# Patient Record
Sex: Female | Born: 1995 | Race: White | Hispanic: No | Marital: Single | Smoking: Never smoker
Health system: Southern US, Community
[De-identification: ages and names within clinical notes are randomized; demographics above are authoritative.]

## PROBLEM LIST (undated history)

## (undated) DIAGNOSIS — E232 Diabetes insipidus: Secondary | ICD-10-CM

## (undated) DIAGNOSIS — R569 Unspecified convulsions: Secondary | ICD-10-CM

## (undated) DIAGNOSIS — T7840XA Allergy, unspecified, initial encounter: Secondary | ICD-10-CM

## (undated) DIAGNOSIS — Q379 Unspecified cleft palate with unilateral cleft lip: Secondary | ICD-10-CM

## (undated) DIAGNOSIS — H409 Unspecified glaucoma: Secondary | ICD-10-CM

## (undated) HISTORY — DX: Unspecified cleft palate with unilateral cleft lip: Q37.9

## (undated) HISTORY — DX: Unspecified glaucoma: H40.9

## (undated) HISTORY — DX: Diabetes insipidus: E23.2

## (undated) HISTORY — DX: Unspecified convulsions: R56.9

## (undated) HISTORY — DX: Allergy, unspecified, initial encounter: T78.40XA

## (undated) HISTORY — PX: EYE SURGERY: SHX253

---
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
## 2014-08-18 ENCOUNTER — Encounter: Payer: Self-pay | Admitting: Family Medicine

## 2014-08-18 ENCOUNTER — Ambulatory Visit (INDEPENDENT_AMBULATORY_CARE_PROVIDER_SITE_OTHER): Payer: BLUE CROSS/BLUE SHIELD | Admitting: Family Medicine

## 2014-08-18 VITALS — BP 119/74 | HR 99 | Ht 64.0 in | Wt 124.0 lb

## 2014-08-18 DIAGNOSIS — G40812 Lennox-Gastaut syndrome, not intractable, without status epilepticus: Secondary | ICD-10-CM | POA: Insufficient documentation

## 2014-08-18 DIAGNOSIS — F79 Unspecified intellectual disabilities: Secondary | ICD-10-CM | POA: Insufficient documentation

## 2014-08-18 DIAGNOSIS — G40409 Other generalized epilepsy and epileptic syndromes, not intractable, without status epilepticus: Secondary | ICD-10-CM

## 2014-08-18 DIAGNOSIS — H409 Unspecified glaucoma: Secondary | ICD-10-CM

## 2014-08-18 DIAGNOSIS — E232 Diabetes insipidus: Secondary | ICD-10-CM

## 2014-08-18 DIAGNOSIS — R32 Unspecified urinary incontinence: Secondary | ICD-10-CM | POA: Insufficient documentation

## 2014-08-18 DIAGNOSIS — Q042 Holoprosencephaly: Secondary | ICD-10-CM | POA: Insufficient documentation

## 2014-08-18 NOTE — Progress Notes (Signed)
CC: Elizabeth [name] is a 19 y.o. 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 Over the past year she has not had any seizures. Understandably family would like to establish with a local neurologist. No new observed motor or sensory disturbances.  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 They've used injectable DDAVP and currently she is using oral tablets and there have been no issues with electrolyte abnormalities for at least the last year. They used to see a endocrinologist and understandably would like to see her local endocrinologist.  Review of Systems - General ROS: negative for - chills, fever, night sweats, weight gain or weight loss Psychological ROS: negative for - anxiety or depression ENT ROS: negative for - hearing change, nasal congestion, tinnitus or allergies Hematological and Lymphatic ROS: negative for - bleeding problems, bruising or swollen lymph nodes Breast ROS: negative Respiratory ROS: no cough, shortness of breath, or wheezing Cardiovascular ROS: no chest pain or dyspnea on exertion Gastrointestinal ROS: no abdominal pain, change in bowel habits, or black or bloody stools Genito-Urinary ROS: negative for - genital discharge, genital ulcers, abnormal bleeding from genitals. Positive for fecal and urinary incontinence  since birth Musculoskeletal ROS: negative for - joint pain or muscle pain Neurological ROS: negative for - headaches or memory loss Dermatological ROS: negative for lumps, mole changes, rash and skin lesion changes  Past Medical History  Diagnosis Date  . Cleft palate and cleft lip     Past Surgical History  Procedure Laterality Date  . Meniscus repair Right 2012   No family history on file.  History   Social History  . Marital Status: Single    Spouse Name: N/A  . Number of Children: N/A  . Years of Education: N/A   Occupational History  . Not on file.   Social History Main Topics  . Smoking status: Never Smoker   . Smokeless tobacco: Not on file  . Alcohol Use: Not on file  . Drug Use: No  . Sexual Activity: No   Other Topics Concern  . Not on file   Social History Narrative  . No narrative on file     Objective: BP 119/74 mmHg  Pulse 99  Ht 5\' 4"  (1.626 m)  Wt 124 lb (56.246 kg)  BMI 21.27 kg/m2  General:  No Acute Distress HEENT: Pupils equal, round.. Conjunctivae clear.  Moist mucous membranes parents unremarkable Lungs: Clear to auscultation bilaterally, no wheezing/ronchi/rales.  Comfortable work of breathing. Good air movement. Cardiac: Regular rate and rhythm. Normal S1/S2.  No murmurs, rubs, nor gallops.   Extremities: No peripheral edema.  Strong peripheral pulses.  Mental Status: No depression, anxiety, nor agitation. Skin: Warm and dry.  Assessment & Plan: Elizabeth [name] was seen today for establish care.  Diagnoses and all orders for this visit:  Myoclonic seizure disorder Orders: -     Ambulatory referral to  Neurology  Glaucoma Orders: -     Ambulatory referral to Ophthalmology  Diabetes insipidus Orders: -     Ambulatory referral to Endocrinology -     BASIC METABOLIC PANEL WITH GFR   Seizure disorder: Controlled, per family request referral to neurology Glaucoma: Referral to ophthalmology Diabetes insipidus: Clinically controlled  we'll get a baseline metabolic panel and referral to endocrinology.  Return for 3-6 months for routine follow up.

## 2014-08-19 ENCOUNTER — Encounter: Payer: Self-pay | Admitting: Family Medicine

## 2014-08-19 LAB — BASIC METABOLIC PANEL WITH GFR
BUN: 11 mg/dL (ref 6–23)
CHLORIDE: 112 meq/L (ref 96–112)
CO2: 25 meq/L (ref 19–32)
CREATININE: 0.86 mg/dL (ref 0.50–1.10)
Calcium: 8.9 mg/dL (ref 8.4–10.5)
GFR, Est African American: >89 mL/min
GFR, Est Non African American: >89 mL/min
GLUCOSE: 98 mg/dL (ref 70–99)
Potassium: 3.7 mEq/L (ref 3.5–5.3)
Sodium: 144 mEq/L (ref 135–145)

## 2014-08-19 NOTE — Addendum Note (Signed)
Addended by: Isaias Cowman C on: 08/19/2014 12:02 PM   Modules accepted: Medications

## 2014-08-24 ENCOUNTER — Encounter: Payer: Self-pay | Admitting: Family Medicine

## 2014-08-24 ENCOUNTER — Telehealth: Payer: Self-pay | Admitting: Family Medicine

## 2014-08-24 NOTE — Telephone Encounter (Signed)
Seth Bake, Will you please ask mother whether or not she's had any luck finding a vendor for diapers for Lyndi? If not please let me know because [employer] might be able to handle this.

## 2014-08-25 NOTE — Telephone Encounter (Signed)
Tried to call and no answer.  

## 2014-08-25 NOTE — Telephone Encounter (Signed)
(  [employer]) youth medium... Spoke with Elmo Putt(?) and she states they will only typically bill medicaid. But she said she would see if she could find something comparable for her in price. She is going to call the mother herself and speak with her. I did give her my phone to call back if she needs more infor from me

## 2014-09-21 ENCOUNTER — Telehealth: Payer: Self-pay | Admitting: *Deleted

## 2014-09-21 NOTE — Telephone Encounter (Signed)
Elizabeth [name],  Mom called and states she has not heard back about from endocrinologist. I saw where you sent everything in and they were supposed  to contact pt. When you get a chance can you look into this?

## 2014-10-09 ENCOUNTER — Telehealth: Payer: Self-pay | Admitting: *Deleted

## 2014-10-09 MED ORDER — ETHYNODIOL DIAC-ETH ESTRADIOL 1-35 MG-MCG PO TABS: 1.0000 | ORAL_TABLET | Freq: Every day | ORAL | Status: DC

## 2014-10-09 NOTE — Telephone Encounter (Signed)
Pt needs refill on BCP. Routing to provider

## 2014-10-09 NOTE — Telephone Encounter (Signed)
Rx sent to [facility] on [address]

## 2014-11-16 DIAGNOSIS — H547 Unspecified visual loss: Secondary | ICD-10-CM | POA: Insufficient documentation

## 2015-01-18 ENCOUNTER — Encounter: Payer: Self-pay | Admitting: Family Medicine

## 2015-01-18 ENCOUNTER — Ambulatory Visit (INDEPENDENT_AMBULATORY_CARE_PROVIDER_SITE_OTHER): Payer: BLUE CROSS/BLUE SHIELD | Admitting: Family Medicine

## 2015-01-18 VITALS — BP 128/72 | HR 104 | Wt 116.0 lb

## 2015-01-18 DIAGNOSIS — L819 Disorder of pigmentation, unspecified: Secondary | ICD-10-CM

## 2015-01-18 DIAGNOSIS — T148XXA Other injury of unspecified body region, initial encounter: Secondary | ICD-10-CM

## 2015-01-18 DIAGNOSIS — T148 Other injury of unspecified body region: Secondary | ICD-10-CM | POA: Diagnosis not present

## 2015-01-18 MED ORDER — NORETHINDRONE 0.35 MG PO TABS
1.0000 | ORAL_TABLET | Freq: Every day | ORAL | Status: DC
Start: 2015-01-18 — End: 2015-07-21

## 2015-01-18 NOTE — Progress Notes (Signed)
CC: Elizabeth [name] is a 19 y.o. female is here for Follow-up   Subjective: HPI:  Mother noticed a pigmented lesion on the back of the patient's scalp while combing her hair a few weeks ago. Patient does not seem to be bothered by it. Patient has never expressed pain or itching at the site. Mother states it is not getting bigger or smaller since she noticed it. There's been no fever, chills, swollen lymph nodes. No skin changes elsewhere.  During our encounter I pointed out that the patient has a splinter in the left palm. Mother states that she noticed that this morning but wasn't there yesterday. No interventions as of yet.  Mother wants to know she can pursue a different type of oral contraception medication to help control periods. Periods are still irregular despite use of combination OCP.   Review Of Systems Outlined In HPI  Past Medical History  Diagnosis Date  . Cleft palate and cleft lip   . DI (diabetes insipidus)     Past Surgical History  Procedure Laterality Date  . Meniscus repair Right 2012   Family History  Problem Relation Age of Onset  . Alcoholism Maternal Grandfather   . Cancer Maternal Grandmother   . Heart attack Maternal Grandmother   . Diabetes Maternal Grandmother     paternal grandfather    Social History   Social History  . Marital Status: Single    Spouse Name: N/A  . Number of Children: N/A  . Years of Education: N/A   Occupational History  . Not on file.   Social History Main Topics  . Smoking status: Never Smoker   . Smokeless tobacco: Not on file  . Alcohol Use: Not on file  . Drug Use: No  . Sexual Activity: No   Other Topics Concern  . Not on file   Social History Narrative     Objective: BP 128/72 mmHg  Pulse 104  Wt 116 lb (52.617 kg)  SpO2 99%  General: Alert, No Acute Distress HEENT: Pupils equal, round, reactive to light. Conjunctivae clear.  Moist mucous membranes. Lungs: Clear to auscultation bilaterally, no  wheezing/ronchi/rales.  Comfortable work of breathing. Good air movement. Cardiac: Regular rate and rhythm. Normal S1/S2.  No murmurs, rubs, nor gallops.   Extremities: No peripheral edema.  Strong peripheral pulses.  Mental Status: No depression, anxiety, nor agitation. Skin: Warm and dry. 3 mm diameter slightly raised mole on the back of the scalp just above the hairline in the midline. Single color, clear demarcated borders. Splinter in the palm of the left hand no signs of infection  Assessment & Plan: Elizabeth [name] was seen today for follow-up.  Diagnoses and all orders for this visit:  Splinter  Pigmented skin lesion  Other orders -     norethindrone (CAMILA) 0.35 MG tablet; Take 1 tablet (0.35 mg total) by mouth daily.   Splinter was removed using 18-gauge needle to slightly scratch the surface of the skin to allow forceps to easily remove the splinter in one piece. Pigmented skin lesion: Discussed this is most likely benign, will keep an eye on it but no need for biopsy at this time Discussed other hormonal ways to help control her menses, joint decision to start [place] prior to pursuing Depo-Medrol.  Return in about 3 months (around 04/20/2015).

## 2015-01-29 ENCOUNTER — Telehealth: Payer: Self-pay | Admitting: Family Medicine

## 2015-01-29 MED ORDER — FOLIC ACID 1 MG PO TABS: 1.0000 mg | ORAL_TABLET | Freq: Every day | ORAL | Status: DC

## 2015-01-29 NOTE — Telephone Encounter (Signed)
Refill req 

## 2015-04-20 ENCOUNTER — Encounter: Payer: Self-pay | Admitting: Family Medicine

## 2015-04-20 ENCOUNTER — Ambulatory Visit (INDEPENDENT_AMBULATORY_CARE_PROVIDER_SITE_OTHER): Payer: BLUE CROSS/BLUE SHIELD | Admitting: Family Medicine

## 2015-04-20 VITALS — BP 127/81 | HR 120 | Wt 111.0 lb

## 2015-04-20 DIAGNOSIS — L708 Other acne: Secondary | ICD-10-CM | POA: Diagnosis not present

## 2015-04-20 DIAGNOSIS — Z309 Encounter for contraceptive management, unspecified: Secondary | ICD-10-CM

## 2015-04-20 DIAGNOSIS — E232 Diabetes insipidus: Secondary | ICD-10-CM | POA: Diagnosis not present

## 2015-04-20 MED ORDER — CLINDAMYCIN HCL 300 MG PO CAPS: 300.0000 mg | ORAL_CAPSULE | Freq: Three times a day (TID) | ORAL | Status: DC

## 2015-04-20 NOTE — Progress Notes (Signed)
CC: Elizabeth [name] is a 19 y.o. female is here for Follow-up   Subjective: HPI: Accompanied by mother  Patient has been using Camilla without any known side effects. Mother notes that mood swings around the time of her menses are now greatly improved.   Follow-up diabetes: She continues to take desmopressin on a daily basis. No unintentional weight loss or gain. She had kidney function and sodium checked yesterday which were normal.  The family's only complaint is some small pustules that form on her bottom is she is left in a wet diaper for more than a few minutes. Mother usually pops these and they go away within a few days. Patient seems to scratch these spots but otherwise does not complain. Mother denies any skin changes elsewhere. There's been no fever, chills or swollen lymph nodes.    Review Of Systems Outlined In HPI  Past Medical History  Diagnosis Date  . Cleft palate and cleft lip   . DI (diabetes insipidus) [facility])     Past Surgical History  Procedure Laterality Date  . Meniscus repair Right 2012   Family History  Problem Relation Age of Onset  . Alcoholism Maternal Grandfather   . Cancer Maternal Grandmother   . Heart attack Maternal Grandmother   . Diabetes Maternal Grandmother     paternal grandfather    Social History   Social History  . Marital Status: Single    Spouse Name: N/A  . Number of Children: N/A  . Years of Education: N/A   Occupational History  . Not on file.   Social History Main Topics  . Smoking status: Never Smoker   . Smokeless tobacco: Not on file  . Alcohol Use: Not on file  . Drug Use: No  . Sexual Activity: No   Other Topics Concern  . Not on file   Social History Narrative     Objective: BP 127/81 mmHg  Pulse 120  Wt 111 lb (50.349 kg)  General: Alert and Oriented, No Acute Distress HEENT: Pupils equal, round, reactive to light. Conjunctivae clear.  Moist mucous membranes Lungs: Clear to auscultation bilaterally, no  wheezing/ronchi/rales.  Comfortable work of breathing. Good air movement. Cardiac: Regular rate and rhythm. Normal S1/S2.  No murmurs, rubs, nor gallops.  Extremities: No peripheral edema.  Strong peripheral pulses.  Mental Status: No depression, anxiety, nor agitation. Skin: Warm and dry.  Assessment & Plan: Elizabeth [name] was seen today for follow-up.  Diagnoses and all orders for this visit:  Diabetes insipidus ([place])  Encounter for contraceptive management, unspecified encounter  Follicular acne -     clindamycin (CLEOCIN) 300 MG capsule; Take 1 capsule (300 mg total) by mouth 3 (three) times daily.   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  Return in about 3 months (around 07/20/2015) for routine follow up.

## 2015-07-20 ENCOUNTER — Ambulatory Visit: Payer: BLUE CROSS/BLUE SHIELD | Admitting: Family Medicine

## 2015-07-21 ENCOUNTER — Ambulatory Visit (INDEPENDENT_AMBULATORY_CARE_PROVIDER_SITE_OTHER): Payer: Medicaid Other | Admitting: Family Medicine

## 2015-07-21 ENCOUNTER — Telehealth: Payer: Self-pay | Admitting: Family Medicine

## 2015-07-21 ENCOUNTER — Encounter: Payer: Self-pay | Admitting: Family Medicine

## 2015-07-21 VITALS — BP 110/71 | HR 87 | Temp 98.2°F | Resp 18 | Wt 113.2 lb

## 2015-07-21 DIAGNOSIS — N926 Irregular menstruation, unspecified: Secondary | ICD-10-CM | POA: Diagnosis not present

## 2015-07-21 DIAGNOSIS — G40409 Other generalized epilepsy and epileptic syndromes, not intractable, without status epilepticus: Secondary | ICD-10-CM

## 2015-07-21 MED ORDER — NORGESTIMATE-ETH ESTRADIOL 0.25-35 MG-MCG PO TABS: 1.0000 | ORAL_TABLET | Freq: Every day | ORAL | Status: DC

## 2015-07-21 NOTE — Progress Notes (Signed)
CC: Elizabeth [name] is a 20 y.o. female is here for Gynecologic Exam   Subjective: HPI:  Follow-up contraceptive management. Patient is taking Camila at 6 PM every day. Despite this she still has a predictable menstrual cycle about every 4 weeks around the same time as her mother's. Mother has also noticed the patient gets urinary frequency and urgency around the times of her menstrual cycle. There's been no other genitourinary complaints. No fevers, chills or abdominal pain. Patient denies pelvic pain.  There would like to know if they can switch to a different neurologist that specializes in epilepsy. They're having difficulty with medication coordination with her current neurologist, Dr. Joesph July.  Review Of Systems Outlined In HPI  Past Medical History  Diagnosis Date  . Cleft palate and cleft lip   . DI (diabetes insipidus) [facility])     Past Surgical History  Procedure Laterality Date  . Meniscus repair Right 2012   Family History  Problem Relation Age of Onset  . Alcoholism Maternal Grandfather   . Cancer Maternal Grandmother   . Heart attack Maternal Grandmother   . Diabetes Maternal Grandmother     paternal grandfather    Social History   Social History  . Marital Status: Single    Spouse Name: N/A  . Number of Children: N/A  . Years of Education: N/A   Occupational History  . Not on file.   Social History Main Topics  . Smoking status: Never Smoker   . Smokeless tobacco: Not on file  . Alcohol Use: Not on file  . Drug Use: No  . Sexual Activity: No   Other Topics Concern  . Not on file   Social History Narrative     Objective: BP 110/71 mmHg  Pulse 87  Temp(Src) 98.2 F (36.8 C) (Axillary)  Resp 18  Wt 113 lb 3.2 oz (51.347 kg)  SpO2 100%  Vital signs reviewed. General: Alert and Oriented, No Acute Distress HEENT: Pupils equal, round, reactive to light. Conjunctivae clear.  External ears unremarkable.  Moist mucous membranes. Lungs: Clear and  comfortable work of breathing, speaking in full sentences without accessory muscle use. Cardiac: Regular rate and rhythm.  Neuro: CN II-XII grossly intact, gait normal. Extremities: No peripheral edema.  Strong peripheral pulses.  Mental Status: No depression, anxiety, nor agitation. Logical though process. Skin: Warm and dry.  Assessment & Plan: [name] was seen today for gynecologic exam.  Diagnoses and all orders for this visit:  Irregular periods  Other orders -     norgestimate-ethinyl estradiol ([place] 28) 0.25-35 MG-MCG tablet; Take 1 tablet by mouth daily.   Switching formulations of her oral birth control, will start [place] with a regimen of starting a new pack when she reaches the placebo pills for 2 consecutive packs then using the placebo pills at the end of the third month. This will hopefully reduce her menstrual cycle to 4 times a year.  Referral to a separate epilepsy specialist within the [facility] system has been placed.    Return in about 3 months (around 10/21/2015).

## 2015-07-21 NOTE — Telephone Encounter (Signed)
Will you please let patient's mother know that I've placed a referral within the [facility] system [place] her [place]'s neurologist changed to Dr. Ishmael Holter Munger-Clary who also takes care of another patient of mine who has a vagal nerve stimulator.

## 2015-07-21 NOTE — Telephone Encounter (Signed)
Notified mother of referral.

## 2015-10-21 ENCOUNTER — Encounter: Payer: Self-pay | Admitting: Family Medicine

## 2015-10-21 ENCOUNTER — Ambulatory Visit (INDEPENDENT_AMBULATORY_CARE_PROVIDER_SITE_OTHER): Payer: Medicaid Other | Admitting: Family Medicine

## 2015-10-21 VITALS — BP 124/68 | HR 74 | Wt 115.0 lb

## 2015-10-21 DIAGNOSIS — N926 Irregular menstruation, unspecified: Secondary | ICD-10-CM

## 2015-10-21 NOTE — Progress Notes (Signed)
CC: Elizabeth [name] is a 20 y.o. female is here for Metrorrhagia   Subjective: HPI:  Mother reports that since changing birth control formations there's been no real improvement with her irregular periods. Elizabeth [name] and she just had 5 days of light menstrual bleeding 10 days without bleeding and started bleeding yesterday. Bleeding is always described as light without clots. There's been no other bleeding abnormalities or bruising. There's been no other vaginal discharge or urinary complaints.   Review Of Systems Outlined In HPI  Past Medical History  Diagnosis Date  . Cleft palate and cleft lip   . DI (diabetes insipidus) [facility])     Past Surgical History  Procedure Laterality Date  . Meniscus repair Right 2012   Family History  Problem Relation Age of Onset  . Alcoholism Maternal Grandfather   . Cancer Maternal Grandmother   . Heart attack Maternal Grandmother   . Diabetes Maternal Grandmother     paternal grandfather    Social History   Social History  . Marital Status: Single    Spouse Name: N/A  . Number of Children: N/A  . Years of Education: N/A   Occupational History  . Not on file.   Social History Main Topics  . Smoking status: Never Smoker   . Smokeless tobacco: Not on file  . Alcohol Use: Not on file  . Drug Use: No  . Sexual Activity: No   Other Topics Concern  . Not on file   Social History Narrative     Objective: BP 124/68 mmHg  Pulse 74  Wt 115 lb (52.164 kg)  Vital signs reviewed. General: Alert , No Acute Distress HEENT: Pupils equal, round, reactive to light. Conjunctivae clear.  External ears unremarkable.  Moist mucous membranes. Lungs: Clear and comfortable work of breathing, speaking in full sentences without accessory muscle use. Cardiac: Regular rate and rhythm.  Neuro: CN II-XII grossly intact, gait normal. Extremities: No peripheral edema.  Strong peripheral pulses.  Mental Status: No depression, anxiety, nor agitation. Logical  though process. Skin: Warm and dry. Assessment & Plan: Elizabeth [name] was seen today for metrorrhagia.  Diagnoses and all orders for this visit:  Irregular periods -     Prolactin -     TSH -     CBC   Rule out platelet abnormality and hormonal abnormality with the above labs. They plan on getting this done when they see the endocrinologist on the seventh of this month  25 minutes spent face-to-face during visit today of which at least 50% was counseling or coordinating care regarding: 1. Irregular periods      Return if symptoms worsen or fail to improve.

## 2015-11-03 ENCOUNTER — Other Ambulatory Visit: Payer: Self-pay

## 2015-11-03 MED ORDER — NORGESTIMATE-ETH ESTRADIOL 0.25-35 MG-MCG PO TABS: 1.0000 | ORAL_TABLET | Freq: Every day | ORAL | Status: DC

## 2015-11-04 ENCOUNTER — Telehealth: Payer: Self-pay | Admitting: Family Medicine

## 2015-11-04 ENCOUNTER — Other Ambulatory Visit: Payer: Self-pay

## 2015-11-04 DIAGNOSIS — N926 Irregular menstruation, unspecified: Secondary | ICD-10-CM

## 2015-11-04 MED ORDER — NORGESTIMATE-ETH ESTRADIOL 0.25-35 MG-MCG PO TABS: 1.0000 | ORAL_TABLET | Freq: Every day | ORAL | Status: DC

## 2015-11-04 NOTE — Telephone Encounter (Signed)
Recommendations left on mothers vm

## 2015-11-04 NOTE — Telephone Encounter (Signed)
Will you please let patient's mother know that her hormonal labs were normal.  If her periods continue to be irregular in one month please f/u to discuss further options.

## 2016-01-17 ENCOUNTER — Other Ambulatory Visit: Payer: Self-pay | Admitting: Family Medicine

## 2016-02-22 ENCOUNTER — Ambulatory Visit (INDEPENDENT_AMBULATORY_CARE_PROVIDER_SITE_OTHER): Payer: Medicaid Other | Admitting: Physician Assistant

## 2016-02-22 ENCOUNTER — Encounter: Payer: Self-pay | Admitting: Physician Assistant

## 2016-02-22 VITALS — BP 124/76 | HR 104 | Ht 64.0 in | Wt 122.0 lb

## 2016-02-22 DIAGNOSIS — N921 Excessive and frequent menstruation with irregular cycle: Secondary | ICD-10-CM

## 2016-02-22 MED ORDER — MEDROXYPROGESTERONE ACETATE 150 MG/ML IM SUSP
150.0000 mg | Freq: Once | INTRAMUSCULAR | Status: AC
  Administered 2016-02-22: 150 mg via INTRAMUSCULAR

## 2016-02-22 NOTE — Addendum Note (Signed)
Addended by: Beatris Ship L on: 02/22/2016 09:50 AM   Modules accepted: Orders

## 2016-02-22 NOTE — Progress Notes (Signed)
   Subjective:    Patient ID: Elizabeth [name], female    DOB: Nov 25, 1995, 20 y.o.   MRN: [id number]  HPI Pt is a 20 yo female with mental retardation that presents to the clinic to establish care after Dr. Ileene Rubens leaving practice.   She was on OCP up to 3 weeks. It was not helping to control bleeding. She was still having long monthly periods and spotting and it is hard for her caregiver to take care of her on her period. She would like to try something else.     Review of Systems  All other systems reviewed and are negative.      Objective:   Physical Exam  Constitutional: She is oriented to person, place, and time. She appears well-developed and well-nourished.  HENT:  Head: Normocephalic and atraumatic.  Cardiovascular: Normal rate, regular rhythm and normal heart sounds.   Pulmonary/Chest: Effort normal and breath sounds normal.  Neurological: She is alert and oriented to person, place, and time.  Psychiatric:  Non-verbal.           Assessment & Plan:  Menorrhagia with irregular cycle- started depo shot today. Follow up in 3 months. Discussed risk factors.

## 2016-05-24 ENCOUNTER — Encounter: Payer: Self-pay | Admitting: Physician Assistant

## 2016-05-24 ENCOUNTER — Ambulatory Visit (INDEPENDENT_AMBULATORY_CARE_PROVIDER_SITE_OTHER): Payer: Medicaid Other | Admitting: Physician Assistant

## 2016-05-24 VITALS — BP 125/81 | HR 99 | Ht 64.0 in | Wt 119.0 lb

## 2016-05-24 DIAGNOSIS — N921 Excessive and frequent menstruation with irregular cycle: Secondary | ICD-10-CM | POA: Diagnosis not present

## 2016-05-24 MED ORDER — MEDROXYPROGESTERONE ACETATE 150 MG/ML IM SUSP
150.0000 mg | Freq: Once | INTRAMUSCULAR | Status: AC
  Administered 2016-05-24: 150 mg via INTRAMUSCULAR

## 2016-05-24 NOTE — Progress Notes (Signed)
   Subjective:    Patient ID: Elizabeth [name], female    DOB: 03/15/1996, 21 y.o.   MRN: [id number]  HPI Pt is a 21 yo female who presents to the clinic for depo shot. She was having heavy periods that was hard for her caregiver to take care of since she is mentally retarded. She has no complaints. She is tolerating shot well. She has had some minor spotting a few times but [facility] better. She is happy with results. She has lost 3lbs.    Review of Systems  All other systems reviewed and are negative.      Objective:   Physical Exam  Constitutional: She appears well-developed and well-nourished.  HENT:  Head: Normocephalic and atraumatic.  Cardiovascular: Normal rate, regular rhythm and normal heart sounds.   Pulmonary/Chest: Effort normal and breath sounds normal.  Neurological: She is alert.  Psychiatric: She has a normal mood and affect. Her behavior is normal.          Assessment & Plan:  Marland KitchenMarland KitchenDiagnoses and all orders for this visit:  Menorrhagia with irregular cycle -     medroxyPROGESTERone (DEPO-PROVERA) injection 150 mg; Inject 1 mL (150 mg total) into the muscle once.   Depo shot given today. Follow up nurse visit in 3 months.

## 2016-08-11 ENCOUNTER — Encounter: Payer: Self-pay | Admitting: Physician Assistant

## 2016-08-11 ENCOUNTER — Ambulatory Visit (INDEPENDENT_AMBULATORY_CARE_PROVIDER_SITE_OTHER): Payer: Medicaid Other | Admitting: Physician Assistant

## 2016-08-11 VITALS — BP 110/68 | HR 100 | Wt 119.3 lb

## 2016-08-11 DIAGNOSIS — N926 Irregular menstruation, unspecified: Secondary | ICD-10-CM

## 2016-08-11 DIAGNOSIS — N921 Excessive and frequent menstruation with irregular cycle: Secondary | ICD-10-CM | POA: Diagnosis not present

## 2016-08-11 MED ORDER — MEDROXYPROGESTERONE ACETATE 150 MG/ML IM SUSP
150.0000 mg | Freq: Once | INTRAMUSCULAR | Status: AC
  Administered 2016-08-11: 150 mg via INTRAMUSCULAR

## 2016-08-11 NOTE — Progress Notes (Signed)
   Subjective:    Patient ID: Elizabeth [name], female    DOB: Jan 24, 1996, 21 y.o.   MRN: [id number]  HPI Pt was here today for a medroxyprogesterone acetate injection. Denies chest pain, shortness of breath, or any medication problems.   Review of Systems     Objective:   Physical Exam        Assessment & Plan:  Pt tolerated LUOQ well without any complications. Pt advised to schedule a nurse visit in 3 months.

## 2016-09-19 ENCOUNTER — Other Ambulatory Visit: Payer: Self-pay

## 2016-09-19 MED ORDER — FOLIC ACID 1 MG PO TABS: 1.0000 mg | ORAL_TABLET | Freq: Every day | ORAL | 2 refills | Status: DC

## 2016-11-01 ENCOUNTER — Ambulatory Visit (INDEPENDENT_AMBULATORY_CARE_PROVIDER_SITE_OTHER): Payer: Medicaid Other | Admitting: Physician Assistant

## 2016-11-01 VITALS — BP 109/68 | HR 108

## 2016-11-01 DIAGNOSIS — Z3042 Encounter for surveillance of injectable contraceptive: Secondary | ICD-10-CM | POA: Diagnosis not present

## 2016-11-01 MED ORDER — MEDROXYPROGESTERONE ACETATE 150 MG/ML IM SUSP
150.0000 mg | Freq: Once | INTRAMUSCULAR | Status: AC
  Administered 2016-11-01: 150 mg via INTRAMUSCULAR

## 2016-11-01 NOTE — Progress Notes (Signed)
Pt came into clinic today, accompanied by her mother, for her depo-provera injection. Pt's mother reports no negative side effects and does state the Pt had some spotting but no full menstrual cycle. Pt tolerated injection in RUOQ well, no immediate complications. Print out given for next injection window dates in August-Septemeber.   Agree with above plan. [name] Planas PA-C

## 2016-11-29 ENCOUNTER — Ambulatory Visit: Payer: Medicaid Other

## 2017-01-31 ENCOUNTER — Ambulatory Visit (INDEPENDENT_AMBULATORY_CARE_PROVIDER_SITE_OTHER): Payer: Medicaid Other | Admitting: Physician Assistant

## 2017-01-31 VITALS — BP 109/73 | HR 103

## 2017-01-31 DIAGNOSIS — Z309 Encounter for contraceptive management, unspecified: Secondary | ICD-10-CM

## 2017-01-31 DIAGNOSIS — Z30013 Encounter for initial prescription of injectable contraceptive: Secondary | ICD-10-CM

## 2017-01-31 MED ORDER — MEDROXYPROGESTERONE ACETATE 150 MG/ML IM SUSP
150.0000 mg | Freq: Once | INTRAMUSCULAR | Status: AC
  Administered 2017-01-31: 150 mg via INTRAMUSCULAR

## 2017-01-31 NOTE — Progress Notes (Signed)
Pt is here accompanied by her mother for depo-provera injection.  No negative side effects noted.  Given in [place], tolerated procedure well.  Print out given with window of dates in November and December for next injection.  Agree with above plan. [name] Planas PA-C

## 2017-04-02 ENCOUNTER — Telehealth: Payer: Self-pay | Admitting: Physician Assistant

## 2017-04-02 NOTE — Telephone Encounter (Signed)
Luvenia Starch,    My aunt called and stated Brunetta has had a head cold with bad cough for almost a week she was giving her generic brand tylenol and she also had a temperature but hasnt had one in the last few days. She was asking if you could call her because due to her medications she is not sure what she could give her over the counter for this. Chasty is eating just not as much as she usually does. Terri's number is [phone]. Thank you

## 2017-04-02 NOTE — Telephone Encounter (Signed)
Called patient and discussed deslym to try. If not improving in next few days come in for office visit.

## 2017-04-24 ENCOUNTER — Ambulatory Visit (INDEPENDENT_AMBULATORY_CARE_PROVIDER_SITE_OTHER): Payer: Medicaid Other | Admitting: Physician Assistant

## 2017-04-24 ENCOUNTER — Encounter: Payer: Self-pay | Admitting: Physician Assistant

## 2017-04-24 VITALS — BP 113/95 | HR 87

## 2017-04-24 DIAGNOSIS — R2231 Localized swelling, mass and lump, right upper limb: Secondary | ICD-10-CM | POA: Insufficient documentation

## 2017-04-24 DIAGNOSIS — Z309 Encounter for contraceptive management, unspecified: Secondary | ICD-10-CM | POA: Diagnosis not present

## 2017-04-24 MED ORDER — MEDROXYPROGESTERONE ACETATE 150 MG/ML IM SUSP
150.0000 mg | Freq: Once | INTRAMUSCULAR | Status: AC
  Administered 2017-04-24: 150 mg via INTRAMUSCULAR

## 2017-04-24 NOTE — Progress Notes (Addendum)
   Subjective:    Patient ID: Elizabeth [name], female    DOB: 03/03/1996, 21 y.o.   MRN: [id number]  HPI [name] is a 22 y/o essentially non verbal female who presents today for depo shot and lump in right armpit.   Her caregiver reports that the lump appeared about 4 weeks ago, went away, then reappeared in the last week. Her caregiver has not noticed any erythema, warm, or discoloration of the skin and states that the patient does not appear to be exhibiting signs of pain in the area. She has not been able to try warm compresses on the area because the patient does not like them.no fever, chills, body aches.   She is tolerating the depo shot well.  .. Active Ambulatory Problems    Diagnosis Date Noted  . 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 Mental retardation 08/18/2014  . 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 Incontinence 08/18/2014  . Glaucoma 08/18/2014  . 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 Irregular periods 07/21/2015  . Menorrhagia with irregular cycle 02/22/2016   Resolved Ambulatory Problems    Diagnosis Date Noted  . No Resolved Ambulatory Problems   Past Medical History:  Diagnosis Date  . Cleft palate and cleft lip   . DI (diabetes insipidus) ([place])      Review of Systems See HPI, all other systems reviewed negative    Objective:   Physical Exam  Constitutional: She appears well-developed and well-nourished.  HENT:  Head: Normocephalic and atraumatic.  Cardiovascular: Normal rate, regular rhythm and normal heart sounds.  Pulmonary/Chest: Effort normal and breath sounds normal.  Neurological: She is alert.  Skin: Skin is warm and dry.  There is a hard, mobile, lump under the skin of the right axilla. It is not warm or erythematous, no ecchymosis, and no skin breakdown present. No tenderness to palpation.   No other lymphadenopathy palpated in right axilla.   Psychiatric: She has a normal mood and affect. Her behavior is normal.  Vitals  reviewed.      Assessment & Plan:  ...Avonda was seen today for mass right axilla and contraception.  Diagnoses and all orders for this visit:  Encounter for contraceptive management, unspecified type -     medroxyPROGESTERone (DEPO-PROVERA) injection 150 mg  Mass of axilla, right   Contraceptive management - Depo shot given today. Patient is tolerating these well. Return for follow-up for next depo shot between February 19-March 5.  Mass of right axilla - Advised patients caregiver to apply warm compresses to area 2x a day for 3-4 weeks. If the masse does not resolve with warm compresses then will get [place] to assess for cyst or other causes of the mass. Advised caregiver that if it grows in size, becomes erythematous, warm, or painful to follow-up for reevaluation. -reassurance no other lymphadenopathy palpated.

## 2017-05-09 ENCOUNTER — Encounter (INDEPENDENT_AMBULATORY_CARE_PROVIDER_SITE_OTHER): Payer: Self-pay

## 2017-07-12 ENCOUNTER — Other Ambulatory Visit: Payer: Self-pay | Admitting: *Deleted

## 2017-07-12 MED ORDER — FOLIC ACID 1 MG PO TABS: 1.0000 mg | ORAL_TABLET | Freq: Every day | ORAL | 3 refills | Status: DC

## 2017-07-18 ENCOUNTER — Ambulatory Visit (INDEPENDENT_AMBULATORY_CARE_PROVIDER_SITE_OTHER): Payer: Medicaid Other | Admitting: Physician Assistant

## 2017-07-18 VITALS — BP 128/73 | HR 81

## 2017-07-18 DIAGNOSIS — Z3042 Encounter for surveillance of injectable contraceptive: Secondary | ICD-10-CM | POA: Diagnosis not present

## 2017-07-18 MED ORDER — MEDROXYPROGESTERONE ACETATE 150 MG/ML IM SUSP
150.0000 mg | Freq: Once | INTRAMUSCULAR | Status: AC
  Administered 2017-07-18: 150 mg via INTRAMUSCULAR

## 2017-07-18 NOTE — Progress Notes (Signed)
Patient came in office for Depo shot. She is within the administration window. Patients mother reports no negative side effects. Patient tolerated injection in RUOQ well, no immediate complications. Printed schedule for next injection, given to mother.  Patients mother reports she recently had an EEG at [facility], routing to PCP.

## 2017-10-03 ENCOUNTER — Ambulatory Visit (INDEPENDENT_AMBULATORY_CARE_PROVIDER_SITE_OTHER): Payer: Medicaid Other | Admitting: Physician Assistant

## 2017-10-03 VITALS — BP 112/67 | HR 84

## 2017-10-03 DIAGNOSIS — Z3042 Encounter for surveillance of injectable contraceptive: Secondary | ICD-10-CM

## 2017-10-03 MED ORDER — MEDROXYPROGESTERONE ACETATE 150 MG/ML IM SUSP
150.0000 mg | Freq: Once | INTRAMUSCULAR | Status: AC
  Administered 2017-10-03: 150 mg via INTRAMUSCULAR

## 2017-10-03 NOTE — Progress Notes (Signed)
Pt is here accompanied by her mother for depo-provera injection.  No negative side effects noted.  Given in [place], tolerated procedure well.  Print out given with window of dates for next injection.  Agree with above plan. [name] Planas PA-C

## 2017-12-19 ENCOUNTER — Ambulatory Visit: Payer: Medicaid Other

## 2017-12-20 ENCOUNTER — Ambulatory Visit (INDEPENDENT_AMBULATORY_CARE_PROVIDER_SITE_OTHER): Payer: Medicaid Other | Admitting: Physician Assistant

## 2017-12-20 VITALS — BP 123/78 | HR 82 | Temp 98.4°F | Wt 113.0 lb

## 2017-12-20 DIAGNOSIS — Z3042 Encounter for surveillance of injectable contraceptive: Secondary | ICD-10-CM

## 2017-12-20 MED ORDER — MEDROXYPROGESTERONE ACETATE 150 MG/ML IM SUSP
150.0000 mg | Freq: Once | INTRAMUSCULAR | Status: AC
  Administered 2017-12-20: 150 mg via INTRAMUSCULAR

## 2017-12-20 NOTE — Progress Notes (Signed)
   Subjective:    Patient ID: Elizabeth [name], female    DOB: 06/07/1995, 22 y.o.   MRN: [id number]  HPI Patient presents to office with mother for a Depo Provera injection. Denies chest pain, shortness of breath, headaches, mood changes or problems with medication. Pt is within 11-14 week window.   Review of Systems     Objective:   Physical Exam        Assessment & Plan:  Patient tolerated injection well without complications in RUOQ. Print out given with window of dates for next injection.  Agree with the above plan. [name] Planas PA-C

## 2018-01-14 ENCOUNTER — Other Ambulatory Visit: Payer: Self-pay

## 2018-01-14 DIAGNOSIS — R32 Unspecified urinary incontinence: Secondary | ICD-10-CM

## 2018-01-14 MED ORDER — PREVAIL ADJ UNDERWEAR SM/MED MISC: 99 refills | Status: AC

## 2018-01-14 MED ORDER — PREVAIL ADJ UNDERWEAR SM/MED MISC: 99 refills | Status: DC

## 2018-02-01 ENCOUNTER — Encounter: Payer: Self-pay | Admitting: Physician Assistant

## 2018-02-01 ENCOUNTER — Ambulatory Visit (INDEPENDENT_AMBULATORY_CARE_PROVIDER_SITE_OTHER): Payer: Medicaid Other | Admitting: Physician Assistant

## 2018-02-01 VITALS — BP 97/52 | HR 85 | Ht 64.0 in | Wt 117.0 lb

## 2018-02-01 DIAGNOSIS — R454 Irritability and anger: Secondary | ICD-10-CM | POA: Insufficient documentation

## 2018-02-01 DIAGNOSIS — Z0000 Encounter for general adult medical examination without abnormal findings: Secondary | ICD-10-CM

## 2018-02-01 NOTE — Patient Instructions (Signed)

## 2018-02-01 NOTE — Progress Notes (Signed)
   Subjective:    Patient ID: Elizabeth [name], female    DOB: 1995-09-16, 22 y.o.   MRN: [id number]  HPI  Patient is a 22 year old female who is accompanied by her caregiver.  She is here for her yearly wellness exam. Pt does have pmhx of DI, seizure disorder, holoprosencephly.  They have no complaints today.  She sees neurologist every 6 months as well as endocrinology every 6 months.  She has had 2 seizures in the last month.  They did start CBD oil and is been very good for her mood.  She feels like her agitation comes out much less.  They are doing 1 drop twice a day.  .. Active Ambulatory Problems    Diagnosis Date Noted  . 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 Mental retardation 08/18/2014  . 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 Incontinence 08/18/2014  . Glaucoma 08/18/2014  . 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 Irregular periods 07/21/2015  . Menorrhagia with irregular cycle 02/22/2016  . Mass of axilla, right 04/24/2017  . Irritability 02/01/2018   Resolved Ambulatory Problems    Diagnosis Date Noted  . No Resolved Ambulatory Problems   Past Medical History:  Diagnosis Date  . Cleft palate and cleft lip   . DI (diabetes insipidus) ([place])    .[name] Family History  Problem Relation Age of Onset  . Alcoholism Maternal Grandfather   . Cancer Maternal Grandmother   . Heart attack Maternal Grandmother   . Diabetes Maternal Grandmother        paternal grandfather   .[name] Social History   Social History Narrative  . Not on file       Review of Systems  All other systems reviewed and are negative.      Objective:   Physical Exam  Constitutional: She is oriented to person, place, and time. She appears well-developed and well-nourished.  HENT:  Head: Normocephalic and atraumatic.  Right Ear: External ear normal.  Left Ear: External ear normal.  Nose: Nose normal.  Mouth/Throat: Oropharynx is clear and moist.  Eyes: Conjunctivae and EOM are normal.   Neck: Normal range of motion. Neck supple. No thyromegaly present.  Cardiovascular: Normal rate and regular rhythm.  Pulmonary/Chest: Effort normal and breath sounds normal.  Abdominal: Soft. Bowel sounds are normal. She exhibits no distension and no mass. There is no tenderness. There is no rebound and no guarding.  Musculoskeletal: Normal range of motion.  Lymphadenopathy:    She has no cervical adenopathy.  Neurological: She is alert and oriented to person, place, and time.  Skin: No rash noted.  Psychiatric: She has a normal mood and affect. Her behavior is normal.          Assessment & Plan:  Marland KitchenMarland KitchenDiagnoses and all orders for this visit:  Routine physical examination  Irritability   Overall patient is doing good. She did declined to flu shot. Per caregiver her pediatrician advised not to long ago. I stated I am uncertain of any interaction. Bring up with neurologist and endocrinologist. If all say yes then lets get it.   Labs 12/2017. Reviewed. No risk factors for CV. Will add lipid to labs at some point.   Continue every 3 months for depo shot.   Continue with CBD oil.   Discussed staying active.

## 2018-03-07 ENCOUNTER — Ambulatory Visit (INDEPENDENT_AMBULATORY_CARE_PROVIDER_SITE_OTHER): Payer: Medicaid Other | Admitting: Osteopathic Medicine

## 2018-03-07 VITALS — BP 109/67 | HR 94 | Ht 64.0 in | Wt 117.0 lb

## 2018-03-07 DIAGNOSIS — Z3042 Encounter for surveillance of injectable contraceptive: Secondary | ICD-10-CM | POA: Diagnosis not present

## 2018-03-07 MED ORDER — MEDROXYPROGESTERONE ACETATE 150 MG/ML IM SUSP
150.0000 mg | Freq: Once | INTRAMUSCULAR | Status: AC
  Administered 2018-03-07: 150 mg via INTRAMUSCULAR

## 2018-03-07 NOTE — Progress Notes (Signed)
Patient presents to office with mother for a Depo Provera injection. Patient denies chest pain, shortness of breath, headaches, mood changes, or any problems with medication. Patient is within 11-14 week window.   Depo Provera injection given in the RUOQ without complications. Next injection window is January 2-16, and patient's mother was advised.

## 2018-05-27 ENCOUNTER — Encounter: Payer: Self-pay | Admitting: Physician Assistant

## 2018-05-27 ENCOUNTER — Ambulatory Visit (INDEPENDENT_AMBULATORY_CARE_PROVIDER_SITE_OTHER): Payer: Medicaid Other | Admitting: Physician Assistant

## 2018-05-27 ENCOUNTER — Ambulatory Visit: Payer: Medicaid Other

## 2018-05-27 VITALS — BP 120/69 | HR 88 | Ht 64.0 in | Wt 125.0 lb

## 2018-05-27 DIAGNOSIS — D224 Melanocytic nevi of scalp and neck: Secondary | ICD-10-CM | POA: Diagnosis not present

## 2018-05-27 DIAGNOSIS — Z3042 Encounter for surveillance of injectable contraceptive: Secondary | ICD-10-CM | POA: Diagnosis not present

## 2018-05-27 MED ORDER — MEDROXYPROGESTERONE ACETATE 150 MG/ML IM SUSP
150.0000 mg | Freq: Once | INTRAMUSCULAR | Status: AC
  Administered 2018-05-27: 150 mg via INTRAMUSCULAR

## 2018-05-27 NOTE — Progress Notes (Addendum)
   Subjective:    Patient ID: Elizabeth [name], female    DOB: 1996/03/02, 23 y.o.   MRN: [id number]  HPI Pt is a 23 yo female with mental retardation who presents to the clinic for depo shot and to have a mole looked at.   Pt has had mole for years. Previous PCP told her not to worry about it unless started to changed. Caregiver noticed it the other day and thought it might be bigger. Does not bother patient.   .. Active Ambulatory Problems    Diagnosis Date Noted  . 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 Mental retardation 08/18/2014  . 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 Incontinence 08/18/2014  . Glaucoma 08/18/2014  . 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 Irregular periods 07/21/2015  . Menorrhagia with irregular cycle 02/22/2016  . Mass of axilla, right 04/24/2017  . Irritability 02/01/2018   Resolved Ambulatory Problems    Diagnosis Date Noted  . No Resolved Ambulatory Problems   Past Medical History:  Diagnosis Date  . Cleft palate and cleft lip   . DI (diabetes insipidus) ([place])       Review of Systems  All other systems reviewed and are negative.      Objective:   Physical Exam Vitals signs reviewed.  Constitutional:      Appearance: Normal appearance.  Cardiovascular:     Rate and Rhythm: Normal rate.  Skin:    Comments: Pigmented pedunculated mole on the occipital region of head covered by hair. Approximately 1cm by 1cm.   Neurological:     General: No focal deficit present.     Mental Status: She is alert and oriented to person, place, and time.           Assessment & Plan:  Marland KitchenMarland KitchenKeyara was seen today for nevus.  Diagnoses and all orders for this visit:  Encounter for surveillance of injectable contraceptive -     medroxyPROGESTERone (DEPO-PROVERA) injection 150 mg  Melanocytic nevus of scalp   Depo shot given today. Follow up in 3 months.   Mole appears normal. Discussed ABCD of concerning moles. It does not bother patient.  Follow up as needed.

## 2018-05-27 NOTE — Addendum Note (Signed)
Addended by: Donella Stade on: 05/27/2018 10:35 AM   Modules accepted: Level of Service

## 2018-06-05 ENCOUNTER — Ambulatory Visit (INDEPENDENT_AMBULATORY_CARE_PROVIDER_SITE_OTHER): Payer: Medicaid Other | Admitting: Physician Assistant

## 2018-06-05 ENCOUNTER — Encounter: Payer: Self-pay | Admitting: Physician Assistant

## 2018-06-05 VITALS — BP 117/68 | HR 90 | Ht 64.0 in | Wt 119.0 lb

## 2018-06-05 DIAGNOSIS — S91114D Laceration without foreign body of right lesser toe(s) without damage to nail, subsequent encounter: Secondary | ICD-10-CM | POA: Diagnosis not present

## 2018-06-05 NOTE — Progress Notes (Signed)
   Subjective:    Patient ID: Elizabeth [name], female    DOB: [birth year], 23 y.o.   MRN: [id number]  HPI  Pt is a 23 yo female with holoprosencephaly, myoclonic seizure disorder, diabetes inspidus who presents to the clinic after an injury to her right 5th toe at the base after a fall from a seizure about 4 days ago. Her caregiver was not with her at the time of the fall. They were out of town in [place] and went to ER. They did try and put steri strip over laceration. Caregiver has been soaking in hydrogen peroxide to keep clean. It does not seem to bother patient too much unless she tries to mess with it. No abnormal dischare, redness, swelling in the area.   .. Active Ambulatory Problems    Diagnosis Date Noted  . 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 Mental retardation 08/18/2014  . 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 Incontinence 08/18/2014  . Glaucoma 08/18/2014  . 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 Irregular periods 07/21/2015  . Menorrhagia with irregular cycle 02/22/2016  . Mass of axilla, right 04/24/2017  . Irritability 02/01/2018   Resolved Ambulatory Problems    Diagnosis Date Noted  . No Resolved Ambulatory Problems   Past Medical History:  Diagnosis Date  . Cleft palate and cleft lip   . DI (diabetes insipidus) ([place])        Review of Systems See HPI.     Objective:   Physical Exam Vitals signs reviewed.  Constitutional:      Appearance: Normal appearance.  Musculoskeletal:       Feet:  Neurological:     Mental Status: She is alert.           Assessment & Plan:  Marland KitchenMarland KitchenSravya was seen today for toe injury.  Diagnoses and all orders for this visit:  Laceration of fifth toe of right foot, subsequent encounter   I could not due to pain fully pull back to see how deep laceration went. Irrigated with normal saline and used dermobond to close wound. Keep gauze and wrapped when in shoe or doing walking. At home ok to leave out. Should be  completely sealed off. If appears area not bonded well. epson water soaks. Stop hydrogen peroxide. Call with any signs of infection. Tylenol/ibuprofen for pain or discomfort.

## 2018-06-18 ENCOUNTER — Other Ambulatory Visit: Payer: Self-pay | Admitting: Physician Assistant

## 2018-06-21 ENCOUNTER — Other Ambulatory Visit: Payer: Self-pay | Admitting: Physician Assistant

## 2018-06-21 DIAGNOSIS — H401133 Primary open-angle glaucoma, bilateral, severe stage: Secondary | ICD-10-CM

## 2018-08-13 ENCOUNTER — Ambulatory Visit: Payer: Medicaid Other

## 2018-08-13 ENCOUNTER — Other Ambulatory Visit: Payer: Self-pay

## 2018-08-13 ENCOUNTER — Ambulatory Visit (INDEPENDENT_AMBULATORY_CARE_PROVIDER_SITE_OTHER): Payer: Medicaid Other | Admitting: Physician Assistant

## 2018-08-13 VITALS — BP 118/75 | HR 104

## 2018-08-13 DIAGNOSIS — Z3042 Encounter for surveillance of injectable contraceptive: Secondary | ICD-10-CM

## 2018-08-13 MED ORDER — MEDROXYPROGESTERONE ACETATE 150 MG/ML IM SUSP
150.0000 mg | Freq: Once | INTRAMUSCULAR | Status: AC
  Administered 2018-08-13: 150 mg via INTRAMUSCULAR

## 2018-08-13 NOTE — Progress Notes (Signed)
Established Patient Office Visit  Subjective:  Patient ID: Elizabeth [name], female    DOB: 1995-06-27  Age: 23 y.o. MRN: [id number]  CC:  Chief Complaint  Patient presents with  . Contraception    HPI Elizabeth [name] presents for Depo Provera injection.   Past Medical History:  Diagnosis Date  . Cleft palate and cleft lip   . DI (diabetes insipidus) ([place])     Past Surgical History:  Procedure Laterality Date  . MENISCUS REPAIR Right 2012    Family History  Problem Relation Age of Onset  . Alcoholism Maternal Grandfather   . Cancer Maternal Grandmother   . Heart attack Maternal Grandmother   . Diabetes Maternal Grandmother        paternal grandfather    Social History   Socioeconomic History  . Marital status: Single    Spouse name: Not on file  . Number of children: Not on file  . Years of education: Not on file  . Highest education level: Not on file  Occupational History  . Not on file  Social Needs  . Financial resource strain: Not on file  . Food insecurity:    Worry: Not on file    Inability: Not on file  . Transportation needs:    Medical: Not on file    Non-medical: Not on file  Tobacco Use  . Smoking status: Never Smoker  . Smokeless tobacco: Never Used  Substance and Sexual Activity  . Alcohol use: Not on file  . Drug use: No  . Sexual activity: Never  Lifestyle  . Physical activity:    Days per week: Not on file    Minutes per session: Not on file  . Stress: Not on file  Relationships  . Social connections:    Talks on phone: Not on file    Gets together: Not on file    Attends religious service: Not on file    Active member of club or organization: Not on file    Attends meetings of clubs or organizations: Not on file    Relationship status: Not on file  . Intimate partner violence:    Fear of current or ex partner: Not on file    Emotionally abused: Not on file    Physically abused: Not on file    Forced sexual activity: Not on file   Other Topics Concern  . Not on file  Social History Narrative  . Not on file    Outpatient Medications Prior to Visit  Medication Sig Dispense Refill  . AMBULATORY NON FORMULARY MEDICATION CBD Oil    . AMBULATORY NON FORMULARY MEDICATION Take 1 drop by mouth 2 (two) times daily. CBD oil 1 dropplet twice a day.    . desmopressin (DDAVP) 0.2 MG tablet Take 0.2 mg by mouth daily. Take two tablets by mouth three times a day    . dorzolamide-timolol (COSOPT) 22.3-6.8 MG/ML ophthalmic solution 1 drop 2 (two) times daily.    . folic acid (FOLVITE) 1 MG tablet TAKE 1 TABLET BY MOUTH ONCE DAILY 90 tablet 3  . Incontinence Supply Disposable (PREVAIL ADJ UNDERWEAR SM/MED) MISC Change underwear as needed. Fax to [phone] each prn  . LamoTRIgine (LAMICTAL PO) Take by mouth. Take 300 mg in am and take 200 mg in the pm    . LevETIRAcetam (KEPPRA PO) Take 10 mLs by mouth 2 (two) times daily.    . Melatonin 5 MG CAPS Take by mouth.    . Rufinamide (  BANZEL PO) Take by mouth. Take 1600 mg two times a day    . Topiramate (TOPAMAX PO) Take 500 mg by mouth.     No facility-administered medications prior to visit.     Allergies  Allergen Reactions  . Antihistamines, Diphenhydramine-Type Other (See Comments)    Per mom all antihistamines seem to counteract with diabetes meds    ROS Review of Systems    Objective:    Physical Exam  BP 118/75   Pulse (!) 104   SpO2 100%  Wt Readings from Last 3 Encounters:  06/05/18 119 lb (54 kg)  05/27/18 125 lb (56.7 kg)  03/07/18 117 lb (53.1 kg)     Health Maintenance Due  Topic Date Due  . PAP SMEAR-Modifier  04/25/2015    There are no preventive care reminders to display for this patient.  No results found for: TSH No results found for: WBC, HGB, HCT, MCV, PLT Lab Results  Component Value Date   NA 144 08/18/2014   K 3.7 08/18/2014   CO2 25 08/18/2014   GLUCOSE 98 08/18/2014   BUN 11 08/18/2014   CREATININE 0.86 08/18/2014    CALCIUM 8.9 08/18/2014   No results found for: CHOL No results found for: HDL No results found for: LDLCALC No results found for: TRIG No results found for: CHOLHDL No results found for: HGBA1C    Assessment & Plan:  Contraceptive - Patient tolerated injection well without complications. Patient advised to schedule next injection 12 weeks from today.     Problem List Items Addressed This Visit    None    Visit Diagnoses    Encounter for surveillance of injectable contraceptive    -  Primary   Relevant Medications   medroxyPROGESTERone (DEPO-PROVERA) injection 150 mg (Start on 08/13/2018  9:00 AM)      Meds ordered this encounter  Medications  . medroxyPROGESTERone (DEPO-PROVERA) injection 150 mg    Follow-up: Return in about 12 weeks (around 11/05/2018) for Depo Provera injection.    Lavell Luster, CMA   Agree with above plan. [name] Planas PA_C

## 2018-08-14 ENCOUNTER — Ambulatory Visit: Payer: Medicaid Other

## 2018-10-25 ENCOUNTER — Telehealth: Payer: Self-pay | Admitting: Physician Assistant

## 2018-10-25 MED ORDER — TIMOLOL MALEATE 0.5 % OP SOLN: 1.0000 [drp] | Freq: Two times a day (BID) | OPHTHALMIC | 0 refills | Status: DC

## 2018-10-25 MED ORDER — DORZOLAMIDE HCL-TIMOLOL MAL 2-0.5 % OP SOLN: 1.0000 [drp] | Freq: Two times a day (BID) | OPHTHALMIC | 0 refills | Status: DC

## 2018-10-25 MED ORDER — DORZOLAMIDE HCL 2 % OP SOLN: 1.0000 [drp] | Freq: Three times a day (TID) | OPHTHALMIC | 0 refills | Status: DC

## 2018-10-25 NOTE — Telephone Encounter (Signed)
Terri (pt's mom) called and states that the pt is out of her eye drop medicine that she has to have. She is schedule to see the eye Dr downstairs here in our building on June 17th  So she needs to at least get enough to last her until that appointment coming up with the eye dr. Please call Terri and advise today. [facility] uses [facility] [address].

## 2018-10-25 NOTE — Telephone Encounter (Signed)
Sent!

## 2018-10-25 NOTE — Telephone Encounter (Signed)
Sent prescription broken up into 2.

## 2018-10-25 NOTE — Telephone Encounter (Signed)
Called pt's mother, Elizabeth [name], and confirmed that patient usually uses Cosopt eye drops, 1 gtt OU BID.  Per pharmacy, eye drops are on back order. Patient is completely out and can't go without. Has appt to establish with new eye doctor down stairs on May 17th.  Can you please review and see if you can sent an eye drop alternative to pharmacy for pt to hold her over until appt with new eye Dr?

## 2018-10-28 NOTE — Telephone Encounter (Signed)
Patient's mom was notified and did not have any questions.

## 2018-10-29 ENCOUNTER — Ambulatory Visit (INDEPENDENT_AMBULATORY_CARE_PROVIDER_SITE_OTHER): Payer: Medicaid Other | Admitting: Osteopathic Medicine

## 2018-10-29 VITALS — BP 112/77 | HR 112

## 2018-10-29 DIAGNOSIS — Z3042 Encounter for surveillance of injectable contraceptive: Secondary | ICD-10-CM | POA: Diagnosis not present

## 2018-10-29 MED ORDER — MEDROXYPROGESTERONE ACETATE 150 MG/ML IM SUSP
150.0000 mg | Freq: Once | INTRAMUSCULAR | Status: AC
  Administered 2018-10-29: 150 mg via INTRAMUSCULAR

## 2018-10-29 NOTE — Progress Notes (Signed)
   Subjective:    Patient ID: Elizabeth [name], female    DOB: Feb 11, 1996, 23 y.o.   MRN: [id number]  HPI Patient is here for a Depo Provera injection. Denies chest pain, shortness of breath, headaches, mood changes or problems with medication. It has been 11 weeks since her last Depo Provera injection   Review of Systems     Objective:   Physical Exam        Assessment & Plan:  Contraception - Patient tolerated injection well without complications. Patient advised to schedule next injection 11-14 weeks from today.

## 2018-11-28 ENCOUNTER — Other Ambulatory Visit: Payer: Self-pay | Admitting: Physician Assistant

## 2019-01-01 ENCOUNTER — Other Ambulatory Visit: Payer: Self-pay | Admitting: Physician Assistant

## 2019-01-02 ENCOUNTER — Other Ambulatory Visit: Payer: Self-pay | Admitting: Family Medicine

## 2019-01-02 MED ORDER — DORZOLAMIDE HCL-TIMOLOL MAL 2-0.5 % OP SOLN: 1.0000 [drp] | Freq: Two times a day (BID) | OPHTHALMIC | 1 refills | Status: DC

## 2019-01-02 NOTE — Telephone Encounter (Signed)
Patient mom was in the office and requesting a refill. It has been sent to the pharmacy. Patient's mom aware.

## 2019-01-14 ENCOUNTER — Ambulatory Visit: Payer: Medicaid Other

## 2019-01-23 ENCOUNTER — Ambulatory Visit (INDEPENDENT_AMBULATORY_CARE_PROVIDER_SITE_OTHER): Payer: Medicaid Other | Admitting: Physician Assistant

## 2019-01-23 DIAGNOSIS — Z3042 Encounter for surveillance of injectable contraceptive: Secondary | ICD-10-CM | POA: Diagnosis not present

## 2019-01-23 MED ORDER — MEDROXYPROGESTERONE ACETATE 150 MG/ML IM SUSP
150.0000 mg | Freq: Once | INTRAMUSCULAR | Status: AC
  Administered 2019-01-23: 16:00:00 150 mg via INTRAMUSCULAR

## 2019-01-23 NOTE — Progress Notes (Signed)
   Subjective:    Patient ID: Elizabeth [name], female    DOB: March 02, 1996, 23 y.o.   MRN: [id number]  HPI Patient is here for a Depo Provera injection. Denies chest pain, shortness of breath, headaches, mood changes or problems with medication. She is within range   Review of Systems     Objective:   Physical Exam        Assessment & Plan:   Patient tolerated injection well without complications. Patient advised to schedule next injection 11-14 weeks from today.

## 2019-03-12 ENCOUNTER — Other Ambulatory Visit: Payer: Self-pay

## 2019-03-12 NOTE — Progress Notes (Signed)
Mother requested med list be updated.   Dr Joesph July has patient on Keppra tablets 500mg , 2 tabs QAM and 2 tab QPM  FYI to PCP

## 2019-03-18 ENCOUNTER — Other Ambulatory Visit: Payer: Self-pay | Admitting: *Deleted

## 2019-03-18 MED ORDER — DORZOLAMIDE HCL-TIMOLOL MAL 2-0.5 % OP SOLN: 1.0000 [drp] | Freq: Two times a day (BID) | OPHTHALMIC | 1 refills | Status: DC

## 2019-04-01 MED ORDER — EPIDIOLEX 100 MG/ML PO SOLN: 2.5000 mg | Freq: Two times a day (BID) | ORAL | 0 refills | Status: DC

## 2019-04-01 NOTE — Progress Notes (Signed)
Patient mom reported that neurology had added CBD oil to the list. It was updated.

## 2019-04-01 NOTE — Addendum Note (Signed)
Addended by: Dessie Coma on: 04/01/2019 04:16 PM   Modules accepted: Orders

## 2019-04-15 ENCOUNTER — Ambulatory Visit: Payer: Medicaid Other

## 2019-04-21 ENCOUNTER — Other Ambulatory Visit: Payer: Self-pay

## 2019-04-21 ENCOUNTER — Ambulatory Visit (INDEPENDENT_AMBULATORY_CARE_PROVIDER_SITE_OTHER): Payer: Medicaid Other | Admitting: Physician Assistant

## 2019-04-21 VITALS — Temp 97.8°F | Ht 64.0 in | Wt 115.0 lb

## 2019-04-21 DIAGNOSIS — Z3042 Encounter for surveillance of injectable contraceptive: Secondary | ICD-10-CM

## 2019-04-21 MED ORDER — MEDROXYPROGESTERONE ACETATE 150 MG/ML IM SUSP
150.0000 mg | Freq: Once | INTRAMUSCULAR | Status: AC
  Administered 2019-04-21: 150 mg via INTRAMUSCULAR

## 2019-04-21 NOTE — Progress Notes (Signed)
Pt is here for a Depo Provera injection. Denies chest pain, shortness of breath, headaches, mood changes or problems with medication. Pt is within shot window for administration. Tolerated in RUOQ well, no immediate complications. Printed copy of next injection window provided. No further questions or concerns.

## 2019-05-26 ENCOUNTER — Ambulatory Visit (INDEPENDENT_AMBULATORY_CARE_PROVIDER_SITE_OTHER): Payer: Medicaid Other | Admitting: Physician Assistant

## 2019-05-26 DIAGNOSIS — Z1152 Encounter for screening for COVID-19: Secondary | ICD-10-CM

## 2019-05-26 DIAGNOSIS — Z209 Contact with and (suspected) exposure to unspecified communicable disease: Secondary | ICD-10-CM | POA: Diagnosis not present

## 2019-05-26 NOTE — Progress Notes (Signed)
Patient ID: Elizabeth [name], female   DOB: 1995/06/24, 24 y.o.   MRN: [id number] Agree with plan.

## 2019-05-26 NOTE — Progress Notes (Signed)
Patient presents to office via drive up for COVID swab prior to procedure. No known exposure and no SX.

## 2019-05-28 LAB — NOVEL CORONAVIRUS, NAA: SARS-CoV-2, NAA: NOT DETECTED

## 2019-05-28 NOTE — Progress Notes (Signed)
Negative for COVID

## 2019-06-04 ENCOUNTER — Other Ambulatory Visit: Payer: Self-pay | Admitting: Physician Assistant

## 2019-06-10 ENCOUNTER — Other Ambulatory Visit: Payer: Self-pay | Admitting: Physician Assistant

## 2019-07-07 ENCOUNTER — Ambulatory Visit: Payer: Medicaid Other

## 2019-07-18 ENCOUNTER — Ambulatory Visit (INDEPENDENT_AMBULATORY_CARE_PROVIDER_SITE_OTHER): Payer: Medicaid Other | Admitting: Physician Assistant

## 2019-07-18 ENCOUNTER — Other Ambulatory Visit: Payer: Self-pay

## 2019-07-18 ENCOUNTER — Encounter: Payer: Self-pay | Admitting: Physician Assistant

## 2019-07-18 VITALS — Temp 97.5°F

## 2019-07-18 DIAGNOSIS — Z3042 Encounter for surveillance of injectable contraceptive: Secondary | ICD-10-CM

## 2019-07-18 MED ORDER — MEDROXYPROGESTERONE ACETATE 150 MG/ML IM SUSP
150.0000 mg | Freq: Once | INTRAMUSCULAR | Status: AC
  Administered 2019-07-18: 16:00:00 150 mg via INTRAMUSCULAR

## 2019-07-18 NOTE — Progress Notes (Signed)
Pt is here for a Depo-Provera injection. Denies chest pains, SOB, h/a's mood changes or problems with medication. Injection administered in the LUOQ. Pt tolerated injection will without complications. Pt advise to schedule next injection in 12 weeks.   Agree with plan. Jade breeback PA-c

## 2019-09-02 ENCOUNTER — Encounter: Payer: Medicaid Other | Admitting: Physician Assistant

## 2019-09-03 ENCOUNTER — Ambulatory Visit (INDEPENDENT_AMBULATORY_CARE_PROVIDER_SITE_OTHER): Payer: Medicaid Other | Admitting: Physician Assistant

## 2019-09-03 ENCOUNTER — Encounter: Payer: Self-pay | Admitting: Physician Assistant

## 2019-09-03 ENCOUNTER — Other Ambulatory Visit: Payer: Self-pay

## 2019-09-03 ENCOUNTER — Ambulatory Visit (INDEPENDENT_AMBULATORY_CARE_PROVIDER_SITE_OTHER): Payer: Medicaid Other | Admitting: Sports Medicine

## 2019-09-03 ENCOUNTER — Ambulatory Visit (INDEPENDENT_AMBULATORY_CARE_PROVIDER_SITE_OTHER): Payer: Medicaid Other

## 2019-09-03 VITALS — BP 96/61 | HR 103 | Ht 64.0 in | Wt 117.0 lb

## 2019-09-03 DIAGNOSIS — H409 Unspecified glaucoma: Secondary | ICD-10-CM | POA: Diagnosis not present

## 2019-09-03 DIAGNOSIS — S92322A Displaced fracture of second metatarsal bone, left foot, initial encounter for closed fracture: Secondary | ICD-10-CM | POA: Insufficient documentation

## 2019-09-03 DIAGNOSIS — Z0000 Encounter for general adult medical examination without abnormal findings: Secondary | ICD-10-CM | POA: Diagnosis not present

## 2019-09-03 DIAGNOSIS — S93622A Sprain of tarsometatarsal ligament of left foot, initial encounter: Secondary | ICD-10-CM

## 2019-09-03 DIAGNOSIS — S92325A Nondisplaced fracture of second metatarsal bone, left foot, initial encounter for closed fracture: Secondary | ICD-10-CM | POA: Diagnosis not present

## 2019-09-03 MED ORDER — TIMOLOL MALEATE 0.5 % OP SOLN: 1.0000 [drp] | Freq: Two times a day (BID) | OPHTHALMIC | 1 refills | Status: DC

## 2019-09-03 MED ORDER — DORZOLAMIDE HCL 2 % OP SOLN: 1.0000 [drp] | Freq: Three times a day (TID) | OPHTHALMIC | 1 refills | Status: DC

## 2019-09-03 MED ORDER — DORZOLAMIDE HCL 2 % OP SOLN: 1.0000 [drp] | Freq: Three times a day (TID) | OPHTHALMIC | 0 refills | Status: DC

## 2019-09-03 NOTE — Assessment & Plan Note (Addendum)
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 1 to 2 weeks ago she had one of her typical drop seizures, her mother noticed a lump on her left foot. On exam she has evidence of tarsometatarsal bossing left worse than right but noted bilaterally. She does not express any tenderness with palpation. Certainly custom orthotics would help but it sounds as though she typically does not like to wear shoes. Her baseline gait has not changed, her activity has not changed, she is behaving at baseline. I think if anything she had a sprain and some underlying tarsometatarsal osteoarthritis but there is no further intervention needed. We will keep an eye on things.  Surprisingly there does appear to be a subacute fracture at the base of the second metatarsal, this is near where the swelling is in the pain is.  I am unsure how old it is but it does not look like a recent fracture.  I do think she needs to be in a postop shoe for now.

## 2019-09-03 NOTE — Progress Notes (Addendum)
    Procedures performed today:    None.  Independent interpretation of notes and tests performed by another provider:   None.  Brief History, Exam, Impression, and Recommendations:    Closed fracture of second metatarsal bone base of left foot This is a pleasant 29 old female with Lennox-Gastaut syndrome. 1 to 2 weeks ago she had one of her typical drop seizures, her mother noticed a lump on her left foot. On exam she has evidence of tarsometatarsal bossing left worse than right but noted bilaterally. She does not express any tenderness with palpation. Certainly custom orthotics would help but it sounds as though she typically does not like to wear shoes. Her baseline gait has not changed, her activity has not changed, she is behaving at baseline. I think if anything she had a sprain and some underlying tarsometatarsal osteoarthritis but there is no further intervention needed. We will keep an eye on things.  Surprisingly there does appear to be a subacute fracture at the base of the second metatarsal, this is near where the swelling is in the pain is.  I am unsure how old it is but it does not look like a recent fracture.  I do think she needs to be in a postop shoe for now.    ___________________________________________ Gwen Her. Dianah Field, M.D., ABFM., CAQSM. Primary Care and [place] Instructor of [place] of [facility] of Medicine

## 2019-09-03 NOTE — Progress Notes (Signed)
Subjective:     Elizabeth [name] is a 24 y.o. female with Diabetes Insipidus, Lennox-Gastaut syndrome, holoprosencephaly, glaucoma, and mental retardation and is here for a comprehensive physical exam. The patient reports no problems. She is accompanied by care giver and legal guardian.   Social History   Socioeconomic History  . Marital status: Single    Spouse name: Not on file  . Number of children: Not on file  . Years of education: Not on file  . Highest education level: Not on file  Occupational History  . Not on file  Tobacco Use  . Smoking status: Never Smoker  . Smokeless tobacco: Never Used  Substance and Sexual Activity  . Alcohol use: Not on file  . Drug use: No  . Sexual activity: Never  Other Topics Concern  . Not on file  Social History Narrative  . Not on file   Social Determinants of Health   Financial Resource Strain:   . Difficulty of Paying Living Expenses:   Food Insecurity:   . Worried About Charity fundraiser in the Last Year:   . Arboriculturist in the Last Year:   Transportation Needs:   . Film/video editor (Medical):   [name] Lack of Transportation (Non-Medical):   Physical Activity:   . Days of Exercise per Week:   . Minutes of Exercise per Session:   Stress:   . Feeling of Stress :   Social Connections:   . Frequency of Communication with Friends and Family:   . Frequency of Social Gatherings with Friends and Family:   . Attends Religious Services:   . Active Member of Clubs or Organizations:   . Attends Archivist Meetings:   [name] Marital Status:   Intimate Partner Violence:   . Fear of Current or Ex-Partner:   . Emotionally Abused:   [name] Physically Abused:   . Sexually Abused:    Health Maintenance  Topic Date Due  . HIV Screening  Never done  . PAP-Cervical Cytology Screening  Never done  . PAP SMEAR-Modifier  Never done  . INFLUENZA VACCINE  12/21/2019  . TETANUS/TDAP  10/16/2021    The following portions of the  patient's history were reviewed and updated as appropriate: allergies, current medications, past family history, past medical history, past social history, past surgical history and problem list.  Review of Systems A comprehensive review of systems was negative.   Objective:    BP 96/61   Pulse (!) 103   Ht 5\' 4"  (1.626 m)   Wt 117 lb (53.1 kg)   BMI 20.08 kg/m  General appearance: cooperative and slowed mentation Head: Normocephalic, without obvious abnormality, atraumatic Eyes: conjunctivae/corneas clear. PERRL, EOM's intact. Fundi benign. Ears: normal TM's and external ear canals both ears Nose: Nares normal. Septum midline. Mucosa normal. No drainage or sinus tenderness. Throat: lips, mucosa, and tongue normal; teeth and gums normal Neck: no adenopathy, no carotid bruit, no JVD, supple, symmetrical, trachea midline and thyroid not enlarged, symmetric, no tenderness/mass/nodules Back: symmetric, no curvature. ROM normal. No CVA tenderness. Lungs: clear to auscultation bilaterally Heart: regular rate and rhythm, S1, S2 normal, no murmur, click, rub or gallop Abdomen: soft, non-tender; bowel sounds normal; no masses,  no organomegaly Extremities: extremities normal, atraumatic, no cyanosis or edema Pulses: 2+ and symmetric Skin: Skin color, texture, turgor normal. No rashes or lesions Lymph nodes: Cervical, supraclavicular, and axillary nodes normal.    Assessment:    Healthy female exam.  Plan:    ..Tonia was seen today for annual exam.  Diagnoses and all orders for this visit:  Routine physical examination  Glaucoma of both eyes, unspecified glaucoma type -     dorzolamide (TRUSOPT) 2 % ophthalmic solution; Place 1 drop into both eyes 3 (three) times daily. -     timolol (TIMOPTIC) 0.5 % ophthalmic solution; Place 1 drop into both eyes 2 (two) times daily.    Discussed 150 minutes of exercise a week.  Encouraged vitamin D 1000 units and Calcium 1300mg  or 4  servings of dairy a day.  Labs done with endocrinology.  Eye drops refilled.  Needs to see opthalmology.  See After Visit Summary for Counseling Recommendations

## 2019-09-03 NOTE — Addendum Note (Signed)
Addended byAnnamaria Helling on: 09/03/2019 01:53 PM   Modules accepted: Orders

## 2019-09-03 NOTE — Patient Instructions (Signed)
Health Maintenance, Female Adopting a healthy lifestyle and getting preventive care are important in promoting health and wellness. Ask your health care provider about:  The right schedule for you to have regular tests and exams.  Things you can do on your own to prevent diseases and keep yourself healthy. What should I know about diet, weight, and exercise? Eat a healthy diet   Eat a diet that includes plenty of vegetables, fruits, low-fat dairy products, and lean protein.  Do not eat a lot of foods that are high in solid fats, added sugars, or sodium. Maintain a healthy weight Body mass index (BMI) is used to identify weight problems. It estimates body fat based on height and weight. Your health care provider can help determine your BMI and help you achieve or maintain a healthy weight. Get regular exercise Get regular exercise. This is one of the most important things you can do for your health. Most adults should:  Exercise for at least 150 minutes each week. The exercise should increase your heart rate and make you sweat (moderate-intensity exercise).  Do strengthening exercises at least twice a week. This is in addition to the moderate-intensity exercise.  Spend less time sitting. Even light physical activity can be beneficial. Watch cholesterol and blood lipids Have your blood tested for lipids and cholesterol at 26 years of age, then have this test every 5 years. Have your cholesterol levels checked more often if:  Your lipid or cholesterol levels are high.  You are older than 26 years of age.  You are at high risk for heart disease. What should I know about cancer screening? Depending on your health history and family history, you may need to have cancer screening at various ages. This may include screening for:  Breast cancer.  Cervical cancer.  Colorectal cancer.  Skin cancer.  Lung cancer. What should I know about heart disease, diabetes, and high blood  pressure? Blood pressure and heart disease  High blood pressure causes heart disease and increases the risk of stroke. This is more likely to develop in people who have high blood pressure readings, are of African descent, or are overweight.  Have your blood pressure checked: ? Every 3-5 years if you are 18-39 years of age. ? Every year if you are 40 years old or older. Diabetes Have regular diabetes screenings. This checks your fasting blood sugar level. Have the screening done:  Once every three years after age 40 if you are at a normal weight and have a low risk for diabetes.  More often and at a younger age if you are overweight or have a high risk for diabetes. What should I know about preventing infection? Hepatitis B If you have a higher risk for hepatitis B, you should be screened for this virus. Talk with your health care provider to find out if you are at risk for hepatitis B infection. Hepatitis C Testing is recommended for:  Everyone born from 1945 through 1965.  Anyone with known risk factors for hepatitis C. Sexually transmitted infections (STIs)  Get screened for STIs, including gonorrhea and chlamydia, if: ? You are sexually active and are younger than 26 years of age. ? You are older than 26 years of age and your health care provider tells you that you are at risk for this type of infection. ? Your sexual activity has changed since you were last screened, and you are at increased risk for chlamydia or gonorrhea. Ask your health care provider if   you are at risk.  Ask your health care provider about whether you are at high risk for HIV. Your health care provider may recommend a prescription medicine to help prevent HIV infection. If you choose to take medicine to prevent HIV, you should first get tested for HIV. You should then be tested every 3 months for as long as you are taking the medicine. Pregnancy  If you are about to stop having your period (premenopausal) and  you may become pregnant, seek counseling before you get pregnant.  Take 400 to 800 micrograms (mcg) of folic acid every day if you become pregnant.  Ask for birth control (contraception) if you want to prevent pregnancy. Osteoporosis and menopause Osteoporosis is a disease in which the bones lose minerals and strength with aging. This can result in bone fractures. If you are 65 years old or older, or if you are at risk for osteoporosis and fractures, ask your health care provider if you should:  Be screened for bone loss.  Take a calcium or vitamin D supplement to lower your risk of fractures.  Be given hormone replacement therapy (HRT) to treat symptoms of menopause. Follow these instructions at home: Lifestyle  Do not use any products that contain nicotine or tobacco, such as cigarettes, e-cigarettes, and chewing tobacco. If you need help quitting, ask your health care provider.  Do not use street drugs.  Do not share needles.  Ask your health care provider for help if you need support or information about quitting drugs. Alcohol use  Do not drink alcohol if: ? Your health care provider tells you not to drink. ? You are pregnant, may be pregnant, or are planning to become pregnant.  If you drink alcohol: ? Limit how much you use to 0-1 drink a day. ? Limit intake if you are breastfeeding.  Be aware of how much alcohol is in your drink. In the [place] one drink equals one 12 oz bottle of beer (355 mL), one 5 oz glass of wine (148 mL), or one 1 oz glass of hard liquor (44 mL). General instructions  Schedule regular health, dental, and eye exams.  Stay current with your vaccines.  Tell your health care provider if: ? You often feel depressed. ? You have ever been abused or do not feel safe at home. Summary  Adopting a healthy lifestyle and getting preventive care are important in promoting health and wellness.  Follow your health care provider's instructions about healthy  diet, exercising, and getting tested or screened for diseases.  Follow your health care provider's instructions on monitoring your cholesterol and blood pressure. This information is not intended to replace advice given to you by your health care provider. Make sure you discuss any questions you have with your health care provider. Document Revised: 05/01/2018 Document Reviewed: 05/01/2018 Elsevier Patient Education  2020 Elsevier Inc.  

## 2019-09-16 ENCOUNTER — Other Ambulatory Visit: Payer: Self-pay | Admitting: Physician Assistant

## 2019-09-26 ENCOUNTER — Ambulatory Visit (INDEPENDENT_AMBULATORY_CARE_PROVIDER_SITE_OTHER): Payer: Medicaid Other | Admitting: Sports Medicine

## 2019-09-26 DIAGNOSIS — S92325G Nondisplaced fracture of second metatarsal bone, left foot, subsequent encounter for fracture with delayed healing: Secondary | ICD-10-CM

## 2019-09-26 NOTE — Assessment & Plan Note (Addendum)
This is a pleasant 24 year old female, approximately 4 weeks ago she had a typical drop seizure that is typical with her Lennox-Gastaut syndrome. Her mother noticed a lump in her foot, they presented to me approximately 3 weeks ago, we obtained x-rays that showed a fracture through the base of the second metatarsal that appeared subacute. Bailyn tends to remove her own postop shoe. Today we placed a short leg walking cast, they do have the postop shoe at home. I do suspect at least 1 to 2 months for healing.

## 2019-09-26 NOTE — Progress Notes (Signed)
    Procedures performed today:    Short leg walking cast placed on the left lower leg  Independent interpretation of notes and tests performed by another provider:   None.  Brief History, Exam, Impression, and Recommendations:    Closed fracture of second metatarsal bone base of left foot This is a pleasant 24 year old female, approximately 4 weeks ago she had a typical drop seizure that is typical with her Lennox-Gastaut syndrome. Her mother noticed a lump in her foot, they presented to me approximately 3 weeks ago, we obtained x-rays that showed a fracture through the base of the second metatarsal that appeared subacute. Nikitta tends to remove her own postop shoe. Today we placed a short leg walking cast, they do have the postop shoe at home. I do suspect at least 1 to 2 months for healing.     ___________________________________________ Gwen Her. Dianah Field, M.D., ABFM., CAQSM. Primary Care and [place] Instructor of [place] of [facility] of Medicine

## 2019-10-06 ENCOUNTER — Ambulatory Visit: Payer: Medicaid Other

## 2019-10-08 ENCOUNTER — Ambulatory Visit (INDEPENDENT_AMBULATORY_CARE_PROVIDER_SITE_OTHER): Payer: Medicaid Other | Admitting: Physician Assistant

## 2019-10-08 VITALS — BP 104/68

## 2019-10-08 DIAGNOSIS — Z3042 Encounter for surveillance of injectable contraceptive: Secondary | ICD-10-CM | POA: Diagnosis not present

## 2019-10-08 MED ORDER — MEDROXYPROGESTERONE ACETATE 150 MG/ML IM SUSP
150.0000 mg | Freq: Once | INTRAMUSCULAR | Status: AC
  Administered 2019-10-08: 150 mg via INTRAMUSCULAR

## 2019-10-08 NOTE — Progress Notes (Signed)
Patient presents today for Depo-Provera 150 mg/1 ml injection. She is scheduled to get this injection every 12 weeks with the last injection given on 07/18/2019 in the [facility].   Patient denies chest pain, palpitations, shortness of breath, abdominal pain, headache, abnormal menstrual bleeding, and mood swings.    Urine HCG was performed to rule out pregnancy prior to injection with a negative pregnancy result.   Injection given in RUOQ. She tolerated the injection well without complications. Patient was instructed to stop at the front desk to schedule a nurse visit for her next injection that will be due between 12/24/2019-01/07/2020. A copy of the Depo-Provera perpetual calendar was given to her with the due dates for her next injection highlighted.

## 2019-10-13 NOTE — Progress Notes (Signed)
Patient ID: Elizabeth [name], female   DOB: 1995/10/24, 24 y.o.   MRN: [id number] Agree with the above plan.

## 2019-10-27 ENCOUNTER — Ambulatory Visit (INDEPENDENT_AMBULATORY_CARE_PROVIDER_SITE_OTHER): Payer: Medicaid Other | Admitting: Sports Medicine

## 2019-10-27 ENCOUNTER — Ambulatory Visit (INDEPENDENT_AMBULATORY_CARE_PROVIDER_SITE_OTHER): Payer: Medicaid Other

## 2019-10-27 ENCOUNTER — Other Ambulatory Visit: Payer: Self-pay

## 2019-10-27 ENCOUNTER — Encounter: Payer: Self-pay | Admitting: Sports Medicine

## 2019-10-27 DIAGNOSIS — S92325G Nondisplaced fracture of second metatarsal bone, left foot, subsequent encounter for fracture with delayed healing: Secondary | ICD-10-CM

## 2019-10-27 NOTE — Assessment & Plan Note (Signed)
This is a 24 year old female, she has a history of Lennox Gestaut syndrome, drop seizures, 4 weeks prior to presentation in April she had a typical drop seizure and seemingly injured her foot, I had initially seen her, we obtained x-rays that showed a linear fracture that appeared subacute with sclerotic edges, I placed her in a postop shoe. She took this off daily, so we transitioned her into a short leg walking cast, ultimately she pulled all of the packing out of the cast and got it wet several times, we removed the cast today. She has no obvious tenderness at the base of the second metatarsal shaft. Per her mother she was able to walk without showing much signs of pain, and for the most part is nonambulatory anyway, so we will probably just avoid further treatment for now. I do suspect this is going to form a fibrous union.

## 2019-10-27 NOTE — Progress Notes (Signed)
° ° °  Procedures performed today:    None.  Independent interpretation of notes and tests performed by another provider:   None.  Brief History, Exam, Impression, and Recommendations:    Closed fracture of second metatarsal bone base of left foot This is a 24 year old female, she has a history of Lennox Gestaut syndrome, drop seizures, 4 weeks prior to presentation in April she had a typical drop seizure and seemingly injured [name] foot, I had initially seen [name], we obtained x-rays that showed a linear fracture that appeared subacute with sclerotic edges, I placed [name] in a postop shoe. She took this off daily, so we transitioned [name] into a short leg walking cast, ultimately she pulled all of the packing out of the cast and got it wet several times, we removed the cast today. She has no obvious tenderness at the base of the second metatarsal shaft. Per [name] mother she was able to walk without showing much signs of pain, and for the most part is nonambulatory anyway, so we will probably just avoid further treatment for now. I do suspect this is going to form a fibrous union.    ___________________________________________ Elizabeth [name]. Dianah Field, M.D., ABFM., CAQSM. Primary Care and [place] Instructor of [place] of [facility] of Medicine

## 2019-12-02 ENCOUNTER — Telehealth (INDEPENDENT_AMBULATORY_CARE_PROVIDER_SITE_OTHER): Payer: Medicaid Other | Admitting: Physician Assistant

## 2019-12-02 ENCOUNTER — Encounter: Payer: Self-pay | Admitting: Physician Assistant

## 2019-12-02 VITALS — Temp 99.3°F | Ht 64.0 in | Wt 117.0 lb

## 2019-12-02 DIAGNOSIS — J0140 Acute pansinusitis, unspecified: Secondary | ICD-10-CM | POA: Diagnosis not present

## 2019-12-02 DIAGNOSIS — J069 Acute upper respiratory infection, unspecified: Secondary | ICD-10-CM

## 2019-12-02 MED ORDER — AMOXICILLIN-POT CLAVULANATE 875-125 MG PO TABS: 1.0000 | ORAL_TABLET | Freq: Two times a day (BID) | ORAL | 0 refills | Status: DC

## 2019-12-02 NOTE — Progress Notes (Signed)
Patient ID: Ramandeep Arington, female   DOB: 1995-11-26, 24 y.o.   MRN: [id number] .Marland KitchenVirtual Visit via Telephone Note  I connected with Arna Medici on 12/02/19 at 10:30 AM EDT by telephone and verified that I am speaking with the correct person using two identifiers.  Location: Patient: home Provider: clinic   I discussed the limitations, risks, security and privacy concerns of performing an evaluation and management service by telephone and the availability of in person appointments. I also discussed with the patient that there may be a patient responsible charge related to this service. The patient expressed understanding and agreed to proceed.   History of Present Illness: Pt is a 24 yo female with mental retardation who is unable to communicate symptoms to me. Her care giver is the historian.   Symptoms started 5 days ago with head congestion, nasal congestion, watery eyes, dry cough.  Symptoms have progressively gotten worse to a slightly more productive cough, blowing out more yellow sputum, not eating.  Her caregiver reports she is having a lot of trouble breathing in through her mouth and nose.  She denies any wheezing.  They have been using Tylenol Cold sinus severe and Sudafed which helps for a little bit but then symptoms come right back.  Patient is starting to get more irritable with her symptoms and starting to cry. She has had 2-3 sick contacts over the past 2 weeks.   .. Active Ambulatory Problems    Diagnosis Date Noted  . Holoprosencephaly ([place]) 08/18/2014  . Mental retardation 08/18/2014  . Lennox-Gastaut syndrome ([place]) 08/18/2014  . Incontinence 08/18/2014  . Glaucoma 08/18/2014  . Diabetes insipidus ([place]) 08/18/2014  . Irregular periods 07/21/2015  . Menorrhagia with irregular cycle 02/22/2016  . Mass of axilla, right 04/24/2017  . Irritability 02/01/2018  . Closed fracture of second metatarsal bone base of left foot 09/03/2019   Resolved Ambulatory Problems     Diagnosis Date Noted  . No Resolved Ambulatory Problems   Past Medical History:  Diagnosis Date  . Cleft palate and cleft lip   . DI (diabetes insipidus) ([place])    Reviewed med, allergy, problem list.   Observations/Objective: No acute distress.  Congested. No labored breathing.  Dry cough.   .. Today's Vitals   12/02/19 0945  Temp: 99.3 F (37.4 C)  TempSrc: Temporal  Weight: 117 lb (53.1 kg)  Height: 5\' 4"  (1.626 m)   Body mass index is 20.08 kg/m.    Assessment and Plan: Marland KitchenMarland KitchenCesilia was seen today for cough.  Diagnoses and all orders for this visit:  Acute non-recurrent pansinusitis -     amoxicillin-clavulanate (AUGMENTIN) 875-125 MG tablet; Take 1 tablet by mouth 2 (two) times daily.  Viral URI -     amoxicillin-clavulanate (AUGMENTIN) 875-125 MG tablet; Take 1 tablet by mouth 2 (two) times daily.   Discussed likely viral URI that has progressed into sinusitis. Treated with augmentin. Consider flonase OTC. Symptomatic care with humidifer, rest , hydration, tylenol for pain/fever.    Follow Up Instructions:    I discussed the assessment and treatment plan with the patient. The patient was provided an opportunity to ask questions and all were answered. The patient agreed with the plan and demonstrated an understanding of the instructions.   The patient was advised to call back or seek an in-person evaluation if the symptoms worsen or if the condition fails to improve as anticipated.  I provided 10 minutes of non-face-to-face time during this encounter.   [name] Planas, PA-C

## 2019-12-02 NOTE — Progress Notes (Signed)
Started 5 days ago Stuffy head, having to breath through mouth Watery eyes Cough (started Sunday, dry) No appetite  Has taken sudafed/ tylenol cold/flu - relieves for a couple hours and then symptoms come back

## 2019-12-10 ENCOUNTER — Other Ambulatory Visit: Payer: Self-pay | Admitting: Physician Assistant

## 2019-12-10 DIAGNOSIS — H409 Unspecified glaucoma: Secondary | ICD-10-CM

## 2019-12-20 ENCOUNTER — Other Ambulatory Visit: Payer: Self-pay | Admitting: Physician Assistant

## 2019-12-29 ENCOUNTER — Ambulatory Visit (INDEPENDENT_AMBULATORY_CARE_PROVIDER_SITE_OTHER): Payer: Medicaid Other | Admitting: Nurse Practitioner

## 2019-12-29 VITALS — BP 119/78 | HR 82 | Ht 64.0 in | Wt 113.8 lb

## 2019-12-29 DIAGNOSIS — Z3042 Encounter for surveillance of injectable contraceptive: Secondary | ICD-10-CM

## 2019-12-29 MED ORDER — MEDROXYPROGESTERONE ACETATE 150 MG/ML IM SUSY: PREFILLED_SYRINGE | Freq: Once | INTRAMUSCULAR | Status: AC

## 2019-12-29 MED ORDER — MEDROXYPROGESTERONE ACETATE 150 MG/ML IM SUSP: 150.0000 mg | Freq: Once | INTRAMUSCULAR | Status: DC

## 2019-12-29 NOTE — Progress Notes (Signed)
Patient is here for a Depo Provera injections.  Injection administered in RUOQ.  Pt tolerated injection well without complications. Patient advised to schedule next injection in 12 weeks.   Agree with assessment and plan of care listed above.  Worthy Keeler, DNP

## 2019-12-30 ENCOUNTER — Ambulatory Visit: Payer: Medicaid Other

## 2020-03-07 ENCOUNTER — Other Ambulatory Visit: Payer: Self-pay | Admitting: Physician Assistant

## 2020-03-07 DIAGNOSIS — H409 Unspecified glaucoma: Secondary | ICD-10-CM

## 2020-03-18 ENCOUNTER — Other Ambulatory Visit: Payer: Self-pay | Admitting: Physician Assistant

## 2020-03-18 DIAGNOSIS — H409 Unspecified glaucoma: Secondary | ICD-10-CM

## 2020-03-22 ENCOUNTER — Ambulatory Visit (INDEPENDENT_AMBULATORY_CARE_PROVIDER_SITE_OTHER): Payer: Medicaid Other | Admitting: Physician Assistant

## 2020-03-22 ENCOUNTER — Other Ambulatory Visit: Payer: Self-pay

## 2020-03-22 DIAGNOSIS — Z3042 Encounter for surveillance of injectable contraceptive: Secondary | ICD-10-CM | POA: Diagnosis not present

## 2020-03-22 MED ORDER — MEDROXYPROGESTERONE ACETATE 150 MG/ML IM SUSP
150.0000 mg | Freq: Once | INTRAMUSCULAR | Status: AC
  Administered 2020-03-22: 150 mg via INTRAMUSCULAR

## 2020-03-22 NOTE — Progress Notes (Signed)
Established Patient Office Visit  Subjective:  Patient ID: Elizabeth [name], female    DOB: 1996-03-05  Age: 24 y.o. MRN: [id number]  CC:  Chief Complaint  Patient presents with  . Contraception    HPI Elizabeth [name] is here for a Depo Provera injection. Denies chest pain, shortness of breath, headaches, mood changes or problems with medication. It has been < 14 weeks since her last Depo Provera injection.    Past Medical History:  Diagnosis Date  . Cleft palate and cleft lip   . DI (diabetes insipidus) ([place])     Past Surgical History:  Procedure Laterality Date  . MENISCUS REPAIR Right 2012    Family History  Problem Relation Age of Onset  . Alcoholism Maternal Grandfather   . Cancer Maternal Grandmother   . Heart attack Maternal Grandmother   . Diabetes Maternal Grandmother        paternal grandfather    Social History   Socioeconomic History  . Marital status: Single    Spouse name: Not on file  . Number of children: Not on file  . Years of education: Not on file  . Highest education level: Not on file  Occupational History  . Not on file  Tobacco Use  . Smoking status: Never Smoker  . Smokeless tobacco: Never Used  Substance and Sexual Activity  . Alcohol use: Not on file  . Drug use: No  . Sexual activity: Never  Other Topics Concern  . Not on file  Social History Narrative  . Not on file   Social Determinants of Health   Financial Resource Strain:   . Difficulty of Paying Living Expenses: Not on file  Food Insecurity:   . Worried About Charity fundraiser in the Last Year: Not on file  . Ran Out of Food in the Last Year: Not on file  Transportation Needs:   . Lack of Transportation (Medical): Not on file  . Lack of Transportation (Non-Medical): Not on file  Physical Activity:   . Days of Exercise per Week: Not on file  . Minutes of Exercise per Session: Not on file  Stress:   . Feeling of Stress : Not on file  Social Connections:   .  Frequency of Communication with Friends and Family: Not on file  . Frequency of Social Gatherings with Friends and Family: Not on file  . Attends Religious Services: Not on file  . Active Member of Clubs or Organizations: Not on file  . Attends Archivist Meetings: Not on file  . Marital Status: Not on file  Intimate Partner Violence:   . Fear of Current or Ex-Partner: Not on file  . Emotionally Abused: Not on file  . Physically Abused: Not on file  . Sexually Abused: Not on file    Outpatient Medications Prior to Visit  Medication Sig Dispense Refill  . AMBULATORY NON FORMULARY MEDICATION CBD Oil    . AMBULATORY NON FORMULARY MEDICATION Take 1 drop by mouth 2 (two) times daily. CBD oil 1 dropplet twice a day.    [name] amoxicillin-clavulanate (AUGMENTIN) 875-125 MG tablet Take 1 tablet by mouth 2 (two) times daily. 20 tablet 0  . Cannabidiol (EPIDIOLEX) 100 MG/ML SOLN Take 2.5 mg by mouth 2 (two) times daily. 300 mL 0  . desmopressin (DDAVP) 0.2 MG tablet Take 0.2 mg by mouth daily. Take two tablets by mouth three times a day    . dorzolamide (TRUSOPT) 2 % ophthalmic solution INSTILL 1  DROP IN BOTH EYES THREE TIMES DAILY 30 mL 0  . dorzolamide-timolol (COSOPT) 22.3-6.8 MG/ML ophthalmic solution INSTILL 1 DROP IN BOTH EYES TWICE DAILY 10 mL 1  . folic acid (FOLVITE) 1 MG tablet TAKE 1 TABLET BY MOUTH EVERY DAY 90 tablet 3  . Incontinence Supply Disposable (PREVAIL ADJ UNDERWEAR SM/MED) MISC Change underwear as needed. Fax to [phone] each prn  . LamoTRIgine (LAMICTAL PO) Take by mouth. Take 300 mg in am and take 200 mg in the pm    . levETIRAcetam (KEPPRA) 500 MG tablet 1,000 mg 2 (two) times daily.    . Melatonin 5 MG CAPS Take by mouth.    . Rufinamide (BANZEL PO) Take by mouth. Take 1600 mg two times a day    . timolol (TIMOPTIC) 0.5 % ophthalmic solution INSTILL 1 DROP IN BOTH EYES TWICE DAILY 10 mL 1  . Topiramate (TOPAMAX PO) Take 300 mg by mouth.     No  facility-administered medications prior to visit.    Allergies  Allergen Reactions  . Antihistamines, Diphenhydramine-Type Other (See Comments)    Per mom all antihistamines seem to counteract with diabetes meds    ROS Review of Systems    Objective:    Physical Exam  There were no vitals taken for this visit. Wt Readings from Last 3 Encounters:  12/29/19 113 lb 12.8 oz (51.6 kg)  12/02/19 117 lb (53.1 kg)  09/03/19 117 lb (53.1 kg)     Health Maintenance Due  Topic Date Due  . Hepatitis C Screening  Never done  . INFLUENZA VACCINE  12/21/2019    There are no preventive care reminders to display for this patient.  No results found for: TSH No results found for: WBC, HGB, HCT, MCV, PLT Lab Results  Component Value Date   NA 144 08/18/2014   K 3.7 08/18/2014   CO2 25 08/18/2014   GLUCOSE 98 08/18/2014   BUN 11 08/18/2014   CREATININE 0.86 08/18/2014   CALCIUM 8.9 08/18/2014   No results found for: CHOL No results found for: HDL No results found for: LDLCALC No results found for: TRIG No results found for: CHOLHDL No results found for: HGBA1C    Assessment & Plan:  Contraception - Patient tolerated injection well without complications. Patient advised to schedule next injection between 06/08/19 - 06/22/2019.  Problem List Items Addressed This Visit    None    Visit Diagnoses    Encounter for surveillance of injectable contraceptive    -  Primary   Relevant Medications   medroxyPROGESTERone (DEPO-PROVERA) injection 150 mg (Completed) (Start on 03/22/2020  3:15 PM)      Meds ordered this encounter  Medications  . medroxyPROGESTERone (DEPO-PROVERA) injection 150 mg    Follow-up: Return in about 12 weeks (around 06/14/2020) for Depo Provera injection. Durene Romans, Monico Blitz, [place]

## 2020-05-12 ENCOUNTER — Ambulatory Visit: Payer: Medicaid Other | Admitting: Physician Assistant

## 2020-05-18 ENCOUNTER — Ambulatory Visit: Payer: Medicaid Other | Admitting: Sports Medicine

## 2020-05-29 ENCOUNTER — Other Ambulatory Visit: Payer: Self-pay | Admitting: Physician Assistant

## 2020-06-06 ENCOUNTER — Other Ambulatory Visit: Payer: Self-pay | Admitting: Physician Assistant

## 2020-06-06 DIAGNOSIS — H409 Unspecified glaucoma: Secondary | ICD-10-CM

## 2020-06-09 ENCOUNTER — Ambulatory Visit: Payer: Medicaid Other

## 2020-06-19 ENCOUNTER — Other Ambulatory Visit: Payer: Self-pay | Admitting: Physician Assistant

## 2020-06-19 DIAGNOSIS — H409 Unspecified glaucoma: Secondary | ICD-10-CM

## 2020-06-21 ENCOUNTER — Ambulatory Visit (INDEPENDENT_AMBULATORY_CARE_PROVIDER_SITE_OTHER): Payer: Medicaid Other | Admitting: Physician Assistant

## 2020-06-21 ENCOUNTER — Other Ambulatory Visit: Payer: Self-pay

## 2020-06-21 DIAGNOSIS — Z3042 Encounter for surveillance of injectable contraceptive: Secondary | ICD-10-CM

## 2020-06-21 MED ORDER — MEDROXYPROGESTERONE ACETATE 150 MG/ML IM SUSP
150.0000 mg | Freq: Once | INTRAMUSCULAR | Status: AC
  Administered 2020-06-21: 150 mg via INTRAMUSCULAR

## 2020-06-21 NOTE — Progress Notes (Signed)
Depo shot given today with no complications.

## 2020-09-05 ENCOUNTER — Other Ambulatory Visit: Payer: Self-pay | Admitting: Physician Assistant

## 2020-09-05 DIAGNOSIS — H409 Unspecified glaucoma: Secondary | ICD-10-CM

## 2020-09-07 ENCOUNTER — Ambulatory Visit (INDEPENDENT_AMBULATORY_CARE_PROVIDER_SITE_OTHER): Payer: Medicaid Other | Admitting: Family Medicine

## 2020-09-07 DIAGNOSIS — Z3042 Encounter for surveillance of injectable contraceptive: Secondary | ICD-10-CM | POA: Diagnosis not present

## 2020-09-07 MED ORDER — MEDROXYPROGESTERONE ACETATE 150 MG/ML IM SUSP: 150.0000 mg | Freq: Once | INTRAMUSCULAR | Status: DC

## 2020-09-07 MED ORDER — MEDROXYPROGESTERONE ACETATE 150 MG/ML IM SUSP
150.0000 mg | Freq: Once | INTRAMUSCULAR | Status: AC
  Administered 2020-09-07: 150 mg via INTRAMUSCULAR

## 2020-09-07 NOTE — Progress Notes (Signed)
Established Patient Office Visit  Subjective:  Patient ID: Elizabeth [name], female    DOB: 1995-12-04  Age: 25 y.o. MRN: [id number]  CC:  Chief Complaint  Patient presents with  . Contraception    HPI Daleysa Kristiansen presents for a Depo Provera injection. Denies chest pain, shortness of breath, headaches, mood changes or problems with medication. It has been < 14 weeks since her last Depo Provera injection. Injection given in LUOQ, pt tolerated well without apparent complications.   Past Medical History:  Diagnosis Date  . Cleft palate and cleft lip   . DI (diabetes insipidus) ([place])     Past Surgical History:  Procedure Laterality Date  . MENISCUS REPAIR Right 2012    Family History  Problem Relation Age of Onset  . Alcoholism Maternal Grandfather   . Cancer Maternal Grandmother   . Heart attack Maternal Grandmother   . Diabetes Maternal Grandmother        paternal grandfather    Social History   Socioeconomic History  . Marital status: Single    Spouse name: Not on file  . Number of children: Not on file  . Years of education: Not on file  . Highest education level: Not on file  Occupational History  . Not on file  Tobacco Use  . Smoking status: Never Smoker  . Smokeless tobacco: Never Used  Substance and Sexual Activity  . Alcohol use: Not on file  . Drug use: No  . Sexual activity: Never  Other Topics Concern  . Not on file  Social History Narrative  . Not on file   Social Determinants of Health   Financial Resource Strain: Not on file  Food Insecurity: Not on file  Transportation Needs: Not on file  Physical Activity: Not on file  Stress: Not on file  Social Connections: Not on file  Intimate Partner Violence: Not on file    Outpatient Medications Prior to Visit  Medication Sig Dispense Refill  . AMBULATORY NON FORMULARY MEDICATION CBD Oil    . AMBULATORY NON FORMULARY MEDICATION Take 1 drop by mouth 2 (two) times daily. CBD oil 1 dropplet twice a  day.    [name] amoxicillin-clavulanate (AUGMENTIN) 875-125 MG tablet Take 1 tablet by mouth 2 (two) times daily. 20 tablet 0  . Cannabidiol (EPIDIOLEX) 100 MG/ML SOLN Take 2.5 mg by mouth 2 (two) times daily. 300 mL 0  . desmopressin (DDAVP) 0.2 MG tablet Take 0.2 mg by mouth daily. Take two tablets by mouth three times a day    . dorzolamide (TRUSOPT) 2 % ophthalmic solution INSTILL 1 DROP IN BOTH EYES THREE TIMES DAILY 30 mL 0  . dorzolamide-timolol (COSOPT) 22.3-6.8 MG/ML ophthalmic solution INSTILL 1 DROP IN BOTH EYES TWICE DAILY 10 mL 1  . folic acid (FOLVITE) 1 MG tablet TAKE 1 TABLET BY MOUTH EVERY DAY 90 tablet 3  . Incontinence Supply Disposable (PREVAIL ADJ UNDERWEAR SM/MED) MISC Change underwear as needed. Fax to [phone] each prn  . LamoTRIgine (LAMICTAL PO) Take by mouth. Take 300 mg in am and take 200 mg in the pm    . levETIRAcetam (KEPPRA) 500 MG tablet 1,000 mg 2 (two) times daily.    . Melatonin 5 MG CAPS Take by mouth.    . Rufinamide (BANZEL PO) Take by mouth. Take 1600 mg two times a day    . timolol (TIMOPTIC) 0.5 % ophthalmic solution INSTILL 1 DROP IN BOTH EYES TWICE DAILY 10 mL 1  . Topiramate (TOPAMAX  PO) Take 300 mg by mouth.     No facility-administered medications prior to visit.    Allergies  Allergen Reactions  . Antihistamines, Diphenhydramine-Type Other (See Comments)    Per mom all antihistamines seem to counteract with diabetes meds    ROS Review of Systems    Objective:    Physical Exam  There were no vitals taken for this visit. Wt Readings from Last 3 Encounters:  12/29/19 113 lb 12.8 oz (51.6 kg)  12/02/19 117 lb (53.1 kg)  09/03/19 117 lb (53.1 kg)     Health Maintenance Due  Topic Date Due  . Hepatitis C Screening  Never done  . HIV Screening  Never done  . HPV VACCINES (2 - 3-dose series) 11/14/2011       Topic Date Due  . HPV VACCINES (2 - 3-dose series) 11/14/2011    No results found for: TSH No results found for:  WBC, HGB, HCT, MCV, PLT Lab Results  Component Value Date   NA 144 08/18/2014   K 3.7 08/18/2014   CO2 25 08/18/2014   GLUCOSE 98 08/18/2014   BUN 11 08/18/2014   CREATININE 0.86 08/18/2014   CALCIUM 8.9 08/18/2014   No results found for: CHOL No results found for: HDL No results found for: LDLCALC No results found for: TRIG No results found for: CHOLHDL No results found for: HGBA1C    Assessment & Plan:  Depo injection given in LUOQ, pt tolerated well without apparent complications. Pt will return in 3 months for next injection. Problem List Items Addressed This Visit   None   Visit Diagnoses    Encounter for surveillance of injectable contraceptive    -  Primary   Relevant Medications   medroxyPROGESTERone (DEPO-PROVERA) injection 150 mg (Completed) (Start on 09/07/2020 11:00 AM)      Meds ordered this encounter  Medications  . DISCONTD: medroxyPROGESTERone (DEPO-PROVERA) injection 150 mg  . medroxyPROGESTERone (DEPO-PROVERA) injection 150 mg    Follow-up: Return in about 3 months (around 12/07/2020) for Depo Injection.    Ninfa Meeker, CMA

## 2020-09-07 NOTE — Progress Notes (Signed)
Medical screening examination/treatment was performed by qualified clinical staff member and as supervising physician I was immediately available for consultation/collaboration. I have reviewed documentation and agree with assessment and plan.  Willem Klingensmith, DO  

## 2020-09-23 ENCOUNTER — Other Ambulatory Visit: Payer: Self-pay | Admitting: Physician Assistant

## 2020-09-23 DIAGNOSIS — H409 Unspecified glaucoma: Secondary | ICD-10-CM

## 2020-11-29 ENCOUNTER — Ambulatory Visit (INDEPENDENT_AMBULATORY_CARE_PROVIDER_SITE_OTHER): Payer: Medicaid Other | Admitting: Physician Assistant

## 2020-11-29 ENCOUNTER — Other Ambulatory Visit: Payer: Self-pay

## 2020-11-29 VITALS — BP 113/80 | HR 94

## 2020-11-29 DIAGNOSIS — Z3042 Encounter for surveillance of injectable contraceptive: Secondary | ICD-10-CM | POA: Diagnosis not present

## 2020-11-29 MED ORDER — MEDROXYPROGESTERONE ACETATE 150 MG/ML IM SUSP
150.0000 mg | Freq: Once | INTRAMUSCULAR | Status: AC
  Administered 2020-11-29: 150 mg via INTRAMUSCULAR

## 2020-11-29 NOTE — Progress Notes (Signed)
Established Patient Office Visit  Subjective:  Patient ID: Elizabeth [name], female    DOB: 04-14-1996  Age: 25 y.o. MRN: [id number]  CC:  Chief Complaint  Patient presents with   Contraception    HPI Elizabeth [name] is here for a Depo Provera injection. Denies chest pain, shortness of breath, headaches, mood changes or problems with medication. It has been < 14 weeks since her last Depo Provera injection.   Past Medical History:  Diagnosis Date   Cleft palate and cleft lip    DI (diabetes insipidus) ([place])     Past Surgical History:  Procedure Laterality Date   MENISCUS REPAIR Right 2012    Family History  Problem Relation Age of Onset   Alcoholism Maternal Grandfather    Cancer Maternal Grandmother    Heart attack Maternal Grandmother    Diabetes Maternal Grandmother        paternal grandfather    Social History   Socioeconomic History   Marital status: Single    Spouse name: Not on file   Number of children: Not on file   Years of education: Not on file   Highest education level: Not on file  Occupational History   Not on file  Tobacco Use   Smoking status: Never   Smokeless tobacco: Never  Substance and Sexual Activity   Alcohol use: Not on file   Drug use: No   Sexual activity: Never  Other Topics Concern   Not on file  Social History Narrative   Not on file   Social Determinants of Health   Financial Resource Strain: Not on file  Food Insecurity: Not on file  Transportation Needs: Not on file  Physical Activity: Not on file  Stress: Not on file  Social Connections: Not on file  Intimate Partner Violence: Not on file    Outpatient Medications Prior to Visit  Medication Sig Dispense Refill   AMBULATORY NON FORMULARY MEDICATION CBD Oil     AMBULATORY NON FORMULARY MEDICATION Take 1 drop by mouth 2 (two) times daily. CBD oil 1 dropplet twice a day.     Cannabidiol (EPIDIOLEX) 100 MG/ML SOLN Take 2.5 mg by mouth 2 (two) times daily. 300 mL 0    desmopressin (DDAVP) 0.2 MG tablet Take 0.2 mg by mouth daily. Take two tablets by mouth three times a day     dorzolamide (TRUSOPT) 2 % ophthalmic solution INSTILL 1 DROP IN BOTH EYES THREE TIMES DAILY 30 mL 0   dorzolamide-timolol (COSOPT) 22.3-6.8 MG/ML ophthalmic solution INSTILL 1 DROP IN BOTH EYES TWICE DAILY 10 mL 1   folic acid (FOLVITE) 1 MG tablet TAKE 1 TABLET BY MOUTH EVERY DAY 90 tablet 3   Incontinence Supply Disposable (PREVAIL ADJ UNDERWEAR SM/MED) MISC Change underwear as needed. Fax to [phone] each prn   LamoTRIgine (LAMICTAL PO) Take by mouth. Take 300 mg in am and take 200 mg in the pm     levETIRAcetam (KEPPRA) 500 MG tablet 1,000 mg 2 (two) times daily.     Melatonin 5 MG CAPS Take by mouth.     Rufinamide (BANZEL PO) Take by mouth. Take 1600 mg two times a day     timolol (TIMOPTIC) 0.5 % ophthalmic solution INSTILL 1 DROP IN BOTH EYES TWICE DAILY 10 mL 1   Topiramate (TOPAMAX PO) Take 300 mg by mouth.     amoxicillin-clavulanate (AUGMENTIN) 875-125 MG tablet Take 1 tablet by mouth 2 (two) times daily. 20 tablet 0   No  facility-administered medications prior to visit.    Allergies  Allergen Reactions   Antihistamines, Diphenhydramine-Type Other (See Comments)    Per mom all antihistamines seem to counteract with diabetes meds    ROS Review of Systems    Objective:    Physical Exam  BP 113/80   Pulse 94  Wt Readings from Last 3 Encounters:  12/29/19 113 lb 12.8 oz (51.6 kg)  12/02/19 117 lb (53.1 kg)  09/03/19 117 lb (53.1 kg)     Health Maintenance Due  Topic Date Due   Pneumococcal Vaccine 11-20 Years old (1 - PCV) Never done   HIV Screening  Never done   HPV VACCINES (2 - 3-dose series) 11/14/2011   Hepatitis C Screening  Never done       Topic Date Due   HPV VACCINES (2 - 3-dose series) 11/14/2011    No results found for: TSH No results found for: WBC, HGB, HCT, MCV, PLT Lab Results  Component Value Date   NA 144 08/18/2014    K 3.7 08/18/2014   CO2 25 08/18/2014   GLUCOSE 98 08/18/2014   BUN 11 08/18/2014   CREATININE 0.86 08/18/2014   CALCIUM 8.9 08/18/2014   No results found for: CHOL No results found for: HDL No results found for: LDLCALC No results found for: TRIG No results found for: CHOLHDL No results found for: HGBA1C    Assessment & Plan:  Contraceptive - Patient tolerated injection well without complications. Patient advised to schedule next injection between September 26 th to October 10 th.   Problem List Items Addressed This Visit   None Visit Diagnoses     Encounter for surveillance of injectable contraceptive    -  Primary   Relevant Medications   medroxyPROGESTERone (DEPO-PROVERA) injection 150 mg (Completed)       Meds ordered this encounter  Medications   medroxyPROGESTERone (DEPO-PROVERA) injection 150 mg    Follow-up: Return in about 12 weeks (around 02/21/2021) for Depo Provera injection. Durene Romans, Monico Blitz, [place]

## 2020-11-29 NOTE — Progress Notes (Signed)
Patient ID: Elizabeth [name], female   DOB: Jul 25, 1995, 25 y.o.   MRN: [id number] Agree with above plan.

## 2020-12-09 ENCOUNTER — Other Ambulatory Visit: Payer: Self-pay | Admitting: Physician Assistant

## 2020-12-09 DIAGNOSIS — H409 Unspecified glaucoma: Secondary | ICD-10-CM

## 2020-12-22 ENCOUNTER — Encounter: Payer: Medicaid Other | Admitting: Physician Assistant

## 2020-12-24 ENCOUNTER — Encounter: Payer: Medicaid Other | Admitting: Physician Assistant

## 2020-12-28 ENCOUNTER — Other Ambulatory Visit: Payer: Self-pay

## 2020-12-28 ENCOUNTER — Ambulatory Visit (INDEPENDENT_AMBULATORY_CARE_PROVIDER_SITE_OTHER): Payer: Medicaid Other | Admitting: Physician Assistant

## 2020-12-28 VITALS — BP 109/64 | HR 99 | Ht 64.0 in | Wt 117.0 lb

## 2020-12-28 DIAGNOSIS — Z0000 Encounter for general adult medical examination without abnormal findings: Secondary | ICD-10-CM | POA: Diagnosis not present

## 2020-12-28 DIAGNOSIS — E232 Diabetes insipidus: Secondary | ICD-10-CM

## 2020-12-28 DIAGNOSIS — Z131 Encounter for screening for diabetes mellitus: Secondary | ICD-10-CM | POA: Diagnosis not present

## 2020-12-28 DIAGNOSIS — Z13220 Encounter for screening for lipoid disorders: Secondary | ICD-10-CM | POA: Diagnosis not present

## 2020-12-28 DIAGNOSIS — Q042 Holoprosencephaly: Secondary | ICD-10-CM

## 2020-12-28 DIAGNOSIS — F79 Unspecified intellectual disabilities: Secondary | ICD-10-CM

## 2020-12-28 NOTE — Progress Notes (Signed)
Subjective:     Elizabeth [name] is a 25 y.o. female and is here for a comprehensive physical exam. The patient reports no problems. Pt is accompanied by her caregiver and communicates for her due to her intellectual disability.   Social History   Socioeconomic History   Marital status: Single    Spouse name: Not on file   Number of children: Not on file   Years of education: Not on file   Highest education level: Not on file  Occupational History   Not on file  Tobacco Use   Smoking status: Never   Smokeless tobacco: Never  Substance and Sexual Activity   Alcohol use: Not on file   Drug use: No   Sexual activity: Never  Other Topics Concern   Not on file  Social History Narrative   Not on file   Social Determinants of Health   Financial Resource Strain: Not on file  Food Insecurity: Not on file  Transportation Needs: Not on file  Physical Activity: Not on file  Stress: Not on file  Social Connections: Not on file  Intimate Partner Violence: Not on file   Health Maintenance  Topic Date Due   HIV Screening  Never done   HPV VACCINES (2 - 3-dose series) 11/14/2011   Hepatitis C Screening  Never done   INFLUENZA VACCINE  12/20/2020   Pneumococcal Vaccine 2-62 Years old (1 - PCV) 12/28/2021 (Originally 04/24/2000)   PAP-Cervical Cytology Screening  09/02/2029 (Originally 04/25/2015)   PAP SMEAR-Modifier  09/02/2029 (Originally 04/25/2015)   TETANUS/TDAP  10/16/2021    The following portions of the patient's history were reviewed and updated as appropriate: allergies, current medications, past family history, past medical history, past social history, past surgical history, and problem list.  Review of Systems A comprehensive review of systems was negative.   Objective:    BP 109/64   Pulse 99   Ht '5\' 4"'$  (1.626 m)   Wt 117 lb (53.1 kg)   SpO2 100%   BMI 20.08 kg/m  General appearance: alert Head: Normocephalic, without obvious abnormality, atraumatic Lungs: no  abnormal lung sounds.  Heart: regular rate and rhythm, S1, S2 normal, no murmur, click, rub or gallop Abdomen: soft, non-tender; bowel sounds normal; no masses,  no organomegaly Extremities: extremities normal, atraumatic, no cyanosis or edema Skin: Skin color, texture, turgor normal. No rashes or lesions or raised pigmented nevus at the back of her head in her hair.  Neurologic: stable. Gait uncoordinated. Speech not recognizable.    Assessment:    Healthy female exam.     Plan:    Marland KitchenMarland KitchenFabiola was seen today for annual exam.  Diagnoses and all orders for this visit:  Routine physical examination -     Lipid Panel w/reflex Direct LDL -     COMPLETE METABOLIC PANEL WITH GFR -     CBC with Differential/Platelet  Screening for diabetes mellitus -     COMPLETE METABOLIC PANEL WITH GFR  Screening, lipid -     Lipid Panel w/reflex Direct LDL  Thyroid disorder screen  .[name] Discussed 150 minutes of exercise a week.  Encouraged vitamin D 1000 units and Calcium '1300mg'$  or 4 servings of dairy a day.  Fasting labs ordered.  Needs flu shot.  Declined covid vaccine.  Declines pap smear.  Continue to see neurology and endocrinology.   Caregiver is wanting her to get into adult daycare with [facility]. Needs evaluation. Call downstairs to see if they do it.  See After Visit Summary for Counseling Recommendations

## 2020-12-28 NOTE — Patient Instructions (Signed)
Health Maintenance, Female Adopting a healthy lifestyle and getting preventive care are important in promoting health and wellness. Ask your health care provider about: The right schedule for you to have regular tests and exams. Things you can do on your own to prevent diseases and keep yourself healthy. What should I know about diet, weight, and exercise? Eat a healthy diet  Eat a diet that includes plenty of vegetables, fruits, low-fat dairy products, and lean protein. Do not eat a lot of foods that are high in solid fats, added sugars, or sodium.  Maintain a healthy weight Body mass index (BMI) is used to identify weight problems. It estimates body fat based on height and weight. Your health care provider can help determineyour BMI and help you achieve or maintain a healthy weight. Get regular exercise Get regular exercise. This is one of the most important things you can do for your health. Most adults should: Exercise for at least 150 minutes each week. The exercise should increase your heart rate and make you sweat (moderate-intensity exercise). Do strengthening exercises at least twice a week. This is in addition to the moderate-intensity exercise. Spend less time sitting. Even light physical activity can be beneficial. Watch cholesterol and blood lipids Have your blood tested for lipids and cholesterol at 27 years of age, then havethis test every 5 years. Have your cholesterol levels checked more often if: Your lipid or cholesterol levels are high. You are older than 27 years of age. You are at high risk for heart disease. What should I know about cancer screening? Depending on your health history and family history, you may need to have cancer screening at various ages. This may include screening for: Breast cancer. Cervical cancer. Colorectal cancer. Skin cancer. Lung cancer. What should I know about heart disease, diabetes, and high blood pressure? Blood pressure and heart  disease High blood pressure causes heart disease and increases the risk of stroke. This is more likely to develop in people who have high blood pressure readings, are of African descent, or are overweight. Have your blood pressure checked: Every 3-5 years if you are 18-39 years of age. Every year if you are 40 years old or older. Diabetes Have regular diabetes screenings. This checks your fasting blood sugar level. Have the screening done: Once every three years after age 40 if you are at a normal weight and have a low risk for diabetes. More often and at a younger age if you are overweight or have a high risk for diabetes. What should I know about preventing infection? Hepatitis B If you have a higher risk for hepatitis B, you should be screened for this virus. Talk with your health care provider to find out if you are at risk forhepatitis B infection. Hepatitis C Testing is recommended for: Everyone born from 1945 through 1965. Anyone with known risk factors for hepatitis C. Sexually transmitted infections (STIs) Get screened for STIs, including gonorrhea and chlamydia, if: You are sexually active and are younger than 27 years of age. You are older than 27 years of age and your health care provider tells you that you are at risk for this type of infection. Your sexual activity has changed since you were last screened, and you are at increased risk for chlamydia or gonorrhea. Ask your health care provider if you are at risk. Ask your health care provider about whether you are at high risk for HIV. Your health care provider may recommend a prescription medicine to help   prevent HIV infection. If you choose to take medicine to prevent HIV, you should first get tested for HIV. You should then be tested every 3 months for as long as you are taking the medicine. Pregnancy If you are about to stop having your period (premenopausal) and you may become pregnant, seek counseling before you get  pregnant. Take 400 to 800 micrograms (mcg) of folic acid every day if you become pregnant. Ask for birth control (contraception) if you want to prevent pregnancy. Osteoporosis and menopause Osteoporosis is a disease in which the bones lose minerals and strength with aging. This can result in bone fractures. If you are 65 years old or older, or if you are at risk for osteoporosis and fractures, ask your health care provider if you should: Be screened for bone loss. Take a calcium or vitamin D supplement to lower your risk of fractures. Be given hormone replacement therapy (HRT) to treat symptoms of menopause. Follow these instructions at home: Lifestyle Do not use any products that contain nicotine or tobacco, such as cigarettes, e-cigarettes, and chewing tobacco. If you need help quitting, ask your health care provider. Do not use street drugs. Do not share needles. Ask your health care provider for help if you need support or information about quitting drugs. Alcohol use Do not drink alcohol if: Your health care provider tells you not to drink. You are pregnant, may be pregnant, or are planning to become pregnant. If you drink alcohol: Limit how much you use to 0-1 drink a day. Limit intake if you are breastfeeding. Be aware of how much alcohol is in your drink. In the [place] one drink equals one 12 oz bottle of beer (355 mL), one 5 oz glass of wine (148 mL), or one 1 oz glass of hard liquor (44 mL). General instructions Schedule regular health, dental, and eye exams. Stay current with your vaccines. Tell your health care provider if: You often feel depressed. You have ever been abused or do not feel safe at home. Summary Adopting a healthy lifestyle and getting preventive care are important in promoting health and wellness. Follow your health care provider's instructions about healthy diet, exercising, and getting tested or screened for diseases. Follow your health care provider's  instructions on monitoring your cholesterol and blood pressure. This information is not intended to replace advice given to you by your health care provider. Make sure you discuss any questions you have with your healthcare provider. Document Revised: 05/01/2018 Document Reviewed: 05/01/2018 Elsevier Patient Education  2022 Elsevier Inc.  

## 2021-01-01 ENCOUNTER — Other Ambulatory Visit: Payer: Self-pay | Admitting: Physician Assistant

## 2021-01-01 DIAGNOSIS — H409 Unspecified glaucoma: Secondary | ICD-10-CM

## 2021-02-02 ENCOUNTER — Telehealth: Payer: Self-pay

## 2021-02-02 ENCOUNTER — Other Ambulatory Visit: Payer: Self-pay

## 2021-02-02 DIAGNOSIS — E870 Hyperosmolality and hypernatremia: Secondary | ICD-10-CM

## 2021-02-02 NOTE — Telephone Encounter (Signed)
Faxed lab results received from Dr. Shirlyn Goltz office stating pt's sodium level was up to 149. His documentation states "Tasa will be repeating labs with her PCP in 1-2 weeks". Per [name] Planas, PA-C, okay to order labs.   Order for CMP entered.

## 2021-02-03 LAB — COMPLETE METABOLIC PANEL WITH GFR
AG Ratio: 1.6 (calc) (ref 1.0–2.5)
ALT: 18 U/L (ref 6–29)
AST: 16 U/L (ref 10–30)
Albumin: 4.6 g/dL (ref 3.6–5.1)
Alkaline phosphatase (APISO): 112 U/L (ref 31–125)
BUN/Creatinine Ratio: 18 (calc) (ref 6–22)
BUN: 19 mg/dL (ref 7–25)
CO2: 23 mmol/L (ref 20–32)
Calcium: 9.3 mg/dL (ref 8.6–10.2)
Chloride: 112 mmol/L — ABNORMAL HIGH (ref 98–110)
Creat: 1.08 mg/dL — ABNORMAL HIGH (ref 0.50–0.96)
Globulin: 2.8 g/dL (calc) (ref 1.9–3.7)
Glucose, Bld: 87 mg/dL (ref 65–99)
Potassium: 3.7 mmol/L (ref 3.5–5.3)
Sodium: 143 mmol/L (ref 135–146)
Total Bilirubin: 0.3 mg/dL (ref 0.2–1.2)
Total Protein: 7.4 g/dL (ref 6.1–8.1)
eGFR: 73 mL/min/{1.73_m2} (ref >=60)

## 2021-02-03 LAB — CBC WITH DIFFERENTIAL/PLATELET
Absolute Monocytes: 441 cells/uL (ref 200–950)
Basophils Absolute: 17 cells/uL (ref 0–200)
Basophils Relative: 0.3 %
Eosinophils Absolute: 29 cells/uL (ref 15–500)
Eosinophils Relative: 0.5 %
HCT: 39.8 % (ref 35.0–45.0)
Hemoglobin: 13.2 g/dL (ref 11.7–15.5)
Lymphs Abs: 1438 cells/uL (ref 850–3900)
MCH: 30.1 pg (ref 27.0–33.0)
MCHC: 33.2 g/dL (ref 32.0–36.0)
MCV: 90.7 fL (ref 80.0–100.0)
MPV: 11.9 fL (ref 7.5–12.5)
Monocytes Relative: 7.6 %
Neutro Abs: 3874 cells/uL (ref 1500–7800)
Neutrophils Relative %: 66.8 %
Platelets: 224 10*3/uL (ref 140–400)
RBC: 4.39 10*6/uL (ref 3.80–5.10)
RDW: 12.2 % (ref 11.0–15.0)
Total Lymphocyte: 24.8 %
WBC: 5.8 10*3/uL (ref 3.8–10.8)

## 2021-02-03 LAB — LIPID PANEL W/REFLEX DIRECT LDL
Cholesterol: 236 mg/dL — ABNORMAL HIGH (ref <200)
HDL: 63 mg/dL (ref >=50)
LDL Cholesterol (Calc): 157 mg/dL (calc) — ABNORMAL HIGH
Non-HDL Cholesterol (Calc): 173 mg/dL (calc) — ABNORMAL HIGH (ref <130)
Total CHOL/HDL Ratio: 3.7 (calc) (ref <5.0)
Triglycerides: 63 mg/dL (ref <150)

## 2021-02-06 ENCOUNTER — Encounter: Payer: Self-pay | Admitting: Physician Assistant

## 2021-02-06 DIAGNOSIS — E7800 Pure hypercholesterolemia, unspecified: Secondary | ICD-10-CM | POA: Insufficient documentation

## 2021-02-06 NOTE — Progress Notes (Signed)
JJ, please send these labs to endocrinology.  GFR above 60 and good.  HDL, good cholesterol, is great.  LDL, bad cholesterol, is not to goal. Her CV risk at 24 is pretty low. Is there a way to make some diet changes to decrease cholesterol? If not I would consider a low dose statin daily to help get her LDL down some.

## 2021-02-07 MED ORDER — ATORVASTATIN CALCIUM 10 MG PO TABS: 10.0000 mg | ORAL_TABLET | Freq: Every day | ORAL | 3 refills | Status: DC

## 2021-02-07 NOTE — Addendum Note (Signed)
Addended by: Donella Stade on: 02/07/2021 11:55 AM   Modules accepted: Orders

## 2021-02-07 NOTE — Progress Notes (Signed)
sent 

## 2021-02-15 ENCOUNTER — Ambulatory Visit: Payer: Medicaid Other

## 2021-02-17 ENCOUNTER — Other Ambulatory Visit: Payer: Self-pay

## 2021-02-17 ENCOUNTER — Ambulatory Visit (INDEPENDENT_AMBULATORY_CARE_PROVIDER_SITE_OTHER): Payer: Medicaid Other | Admitting: Physician Assistant

## 2021-02-17 DIAGNOSIS — Z3042 Encounter for surveillance of injectable contraceptive: Secondary | ICD-10-CM

## 2021-02-17 MED ORDER — MEDROXYPROGESTERONE ACETATE 150 MG/ML IM SUSP
150.0000 mg | Freq: Once | INTRAMUSCULAR | Status: AC
  Administered 2021-02-17: 150 mg via INTRAMUSCULAR

## 2021-02-17 NOTE — Progress Notes (Signed)
Patient ID: Elizabeth [name], female   DOB: 1996-03-18, 25 y.o.   MRN: [id number] Agree with the above plan. Follow up in 3 months.

## 2021-02-17 NOTE — Progress Notes (Signed)
Established Patient Office Visit  Subjective:  Patient ID: Elizabeth [name], female    DOB: October 24, 1995  Age: 25 y.o. MRN: [id number]  CC:  Chief Complaint  Patient presents with   Contraception    HPI Elizabeth [name] presents for a Depo Provera injection. Denies chest pain, shortness of breath, headaches, mood changes or problems with medication. It has been < 14 weeks since her last Depo Provera injection   Past Medical History:  Diagnosis Date   Cleft palate and cleft lip    DI (diabetes insipidus) ([place])     Past Surgical History:  Procedure Laterality Date   MENISCUS REPAIR Right 2012    Family History  Problem Relation Age of Onset   Alcoholism Maternal Grandfather    Cancer Maternal Grandmother    Heart attack Maternal Grandmother    Diabetes Maternal Grandmother        paternal grandfather    Social History   Socioeconomic History   Marital status: Single    Spouse name: Not on file   Number of children: Not on file   Years of education: Not on file   Highest education level: Not on file  Occupational History   Not on file  Tobacco Use   Smoking status: Never   Smokeless tobacco: Never  Substance and Sexual Activity   Alcohol use: Not on file   Drug use: No   Sexual activity: Never  Other Topics Concern   Not on file  Social History Narrative   Not on file   Social Determinants of Health   Financial Resource Strain: Not on file  Food Insecurity: Not on file  Transportation Needs: Not on file  Physical Activity: Not on file  Stress: Not on file  Social Connections: Not on file  Intimate Partner Violence: Not on file    Outpatient Medications Prior to Visit  Medication Sig Dispense Refill   AMBULATORY NON FORMULARY MEDICATION CBD Oil     AMBULATORY NON FORMULARY MEDICATION Take 1 drop by mouth 2 (two) times daily. CBD oil 1 dropplet twice a day.     atorvastatin (LIPITOR) 10 MG tablet Take 1 tablet (10 mg total) by mouth daily. 90 tablet 3    Cannabidiol (EPIDIOLEX) 100 MG/ML SOLN Take 2.5 mg by mouth 2 (two) times daily. 300 mL 0   desmopressin (DDAVP) 0.2 MG tablet Take 0.2 mg by mouth daily. Take two tablets by mouth three times a day     dorzolamide (TRUSOPT) 2 % ophthalmic solution INSTILL 1 DROP IN BOTH EYES THREE TIMES DAILY 30 mL 0   dorzolamide-timolol (COSOPT) 22.3-6.8 MG/ML ophthalmic solution INSTILL 1 DROP IN BOTH EYES TWICE DAILY 10 mL 1   folic acid (FOLVITE) 1 MG tablet TAKE 1 TABLET BY MOUTH EVERY DAY 90 tablet 3   Incontinence Supply Disposable (PREVAIL ADJ UNDERWEAR SM/MED) MISC Change underwear as needed. Fax to [phone] each prn   LamoTRIgine (LAMICTAL PO) Take by mouth. Take 300 mg in am and take 200 mg in the pm     levETIRAcetam (KEPPRA) 500 MG tablet 1,000 mg 2 (two) times daily.     Melatonin 5 MG CAPS Take by mouth.     Rufinamide (BANZEL PO) Take by mouth. Take 1600 mg two times a day     timolol (TIMOPTIC) 0.5 % ophthalmic solution INSTILL 1 DROP IN BOTH EYES TWICE DAILY 10 mL 1   Topiramate (TOPAMAX PO) Take 300 mg by mouth.     No facility-administered  medications prior to visit.    Allergies  Allergen Reactions   Antihistamines, Diphenhydramine-Type Other (See Comments)    Per mom all antihistamines seem to counteract with diabetes meds    ROS Review of Systems    Objective:    Physical Exam  There were no vitals taken for this visit. Wt Readings from Last 3 Encounters:  12/28/20 117 lb (53.1 kg)  12/29/19 113 lb 12.8 oz (51.6 kg)  12/02/19 117 lb (53.1 kg)     Health Maintenance Due  Topic Date Due   HIV Screening  Never done   HPV VACCINES (2 - 3-dose series) 11/14/2011   Hepatitis C Screening  Never done   INFLUENZA VACCINE  12/20/2020       Topic Date Due   HPV VACCINES (2 - 3-dose series) 11/14/2011    No results found for: TSH Lab Results  Component Value Date   WBC 5.8 02/02/2021   HGB 13.2 02/02/2021   HCT 39.8 02/02/2021   MCV 90.7 02/02/2021    PLT 224 02/02/2021   Lab Results  Component Value Date   NA 143 02/02/2021   K 3.7 02/02/2021   CO2 23 02/02/2021   GLUCOSE 87 02/02/2021   BUN 19 02/02/2021   CREATININE 1.08 (H) 02/02/2021   BILITOT 0.3 02/02/2021   AST 16 02/02/2021   ALT 18 02/02/2021   PROT 7.4 02/02/2021   CALCIUM 9.3 02/02/2021   EGFR 73 02/02/2021   Lab Results  Component Value Date   CHOL 236 (H) 02/02/2021   Lab Results  Component Value Date   HDL 63 02/02/2021   Lab Results  Component Value Date   LDLCALC 157 (H) 02/02/2021   Lab Results  Component Value Date   TRIG 63 02/02/2021   Lab Results  Component Value Date   CHOLHDL 3.7 02/02/2021   No results found for: HGBA1C    Assessment & Plan:  Depo given in [place]. Pt tolerated well with no apparent complications. Pt will return in 11-13 weeks for next injection. Problem List Items Addressed This Visit   None Visit Diagnoses     Encounter for surveillance of injectable contraceptive    -  Primary   Relevant Medications   medroxyPROGESTERone (DEPO-PROVERA) injection 150 mg (Completed) (Start on 02/17/2021 11:00 AM)       Meds ordered this encounter  Medications   medroxyPROGESTERone (DEPO-PROVERA) injection 150 mg    Follow-up: Return 11-13 weeks, for Depo provera injection.    Elizabeth [name], CMA

## 2021-03-18 ENCOUNTER — Other Ambulatory Visit: Payer: Self-pay | Admitting: Physician Assistant

## 2021-03-18 DIAGNOSIS — H409 Unspecified glaucoma: Secondary | ICD-10-CM

## 2021-04-19 ENCOUNTER — Other Ambulatory Visit: Payer: Self-pay

## 2021-04-19 DIAGNOSIS — H409 Unspecified glaucoma: Secondary | ICD-10-CM

## 2021-04-19 MED ORDER — TIMOLOL MALEATE 0.5 % OP SOLN: 1.0000 [drp] | Freq: Two times a day (BID) | OPHTHALMIC | 1 refills | Status: DC

## 2021-05-06 ENCOUNTER — Other Ambulatory Visit: Payer: Self-pay

## 2021-05-06 ENCOUNTER — Ambulatory Visit (INDEPENDENT_AMBULATORY_CARE_PROVIDER_SITE_OTHER): Payer: Medicaid Other | Admitting: Medical-Surgical

## 2021-05-06 VITALS — Ht 64.0 in | Wt 117.0 lb

## 2021-05-06 DIAGNOSIS — Z3042 Encounter for surveillance of injectable contraceptive: Secondary | ICD-10-CM

## 2021-05-06 MED ORDER — MEDROXYPROGESTERONE ACETATE 150 MG/ML IM SUSP
150.0000 mg | Freq: Once | INTRAMUSCULAR | Status: AC
  Administered 2021-05-06: 150 mg via INTRAMUSCULAR

## 2021-05-06 NOTE — Progress Notes (Signed)
Pt is here for a Depo Provera injection. Denies chest pain, SOB, headaches, mood changes or problems with medication.  Injection administered RUOQ. Pt tolerated injection well without complications. Pt advised to schedule next injection in 12 weeks.

## 2021-05-06 NOTE — Progress Notes (Signed)
Agree with above.   ___________________________________________ Clearnce Sorrel, DNP, APRN, FNP-BC [facility]

## 2021-05-24 ENCOUNTER — Other Ambulatory Visit: Payer: Self-pay | Admitting: Physician Assistant

## 2021-05-25 ENCOUNTER — Other Ambulatory Visit: Payer: Self-pay | Admitting: Physician Assistant

## 2021-05-25 MED ORDER — LEVETIRACETAM 500 MG PO TABS: 1000.0000 mg | ORAL_TABLET | Freq: Two times a day (BID) | ORAL | 3 refills | Status: AC

## 2021-05-25 NOTE — Telephone Encounter (Signed)
We have not prescribed these medications for the patient previously.  Please review and refill if appropriate.  T. Sebert Stollings, CMA  

## 2021-06-17 ENCOUNTER — Other Ambulatory Visit: Payer: Self-pay | Admitting: Physician Assistant

## 2021-06-17 DIAGNOSIS — H409 Unspecified glaucoma: Secondary | ICD-10-CM

## 2021-06-27 ENCOUNTER — Other Ambulatory Visit (HOSPITAL_COMMUNITY): Payer: Self-pay

## 2021-06-27 ENCOUNTER — Other Ambulatory Visit: Payer: Self-pay | Admitting: Physician Assistant

## 2021-06-27 MED ORDER — CANNABIDIOL 100 MG/ML PO SOLN
2.5000 mg/kg | Freq: Two times a day (BID) | ORAL | 2 refills | Status: AC
  Filled 2021-06-27: qty 200, 77d supply, fill #0

## 2021-06-27 NOTE — Telephone Encounter (Signed)
Is this correct? 

## 2021-06-27 NOTE — Telephone Encounter (Signed)
Its saying I cannot prescribe 2.5mg . can we call in what she was on to the pharmacy?

## 2021-06-29 ENCOUNTER — Telehealth: Payer: Self-pay

## 2021-06-29 DIAGNOSIS — S92325G Nondisplaced fracture of second metatarsal bone, left foot, subsequent encounter for fracture with delayed healing: Secondary | ICD-10-CM

## 2021-06-29 NOTE — Telephone Encounter (Signed)
Elizabeth [name] states Elizabeth [name] has fallen again last night and she has concerns regarding the left foot. She was treated previously in 2021 for fx. She would like an order for xray to recheck this.  Orders pended

## 2021-06-30 NOTE — Telephone Encounter (Signed)
Happy to order the x-ray but she needs to know it does not really mean anything until I can actually take a look at the foot and touch it.  In addition, before I order this are we sure its the foot, could be the ankle?

## 2021-07-01 ENCOUNTER — Other Ambulatory Visit: Payer: Self-pay

## 2021-07-01 ENCOUNTER — Ambulatory Visit (INDEPENDENT_AMBULATORY_CARE_PROVIDER_SITE_OTHER): Payer: Medicaid Other

## 2021-07-01 ENCOUNTER — Ambulatory Visit: Payer: Medicaid Other

## 2021-07-01 DIAGNOSIS — Z8781 Personal history of (healed) traumatic fracture: Secondary | ICD-10-CM

## 2021-07-01 DIAGNOSIS — S92325G Nondisplaced fracture of second metatarsal bone, left foot, subsequent encounter for fracture with delayed healing: Secondary | ICD-10-CM

## 2021-07-01 DIAGNOSIS — W19XXXA Unspecified fall, initial encounter: Secondary | ICD-10-CM

## 2021-07-01 NOTE — Telephone Encounter (Signed)
Okay sounds good. She is aware and states she is concerned about Elizabeth [name]'s ankle too so they came in today for left foot/ankle xrays. Aware appt will need to be made

## 2021-07-22 ENCOUNTER — Ambulatory Visit (INDEPENDENT_AMBULATORY_CARE_PROVIDER_SITE_OTHER): Payer: Medicaid Other | Admitting: Physician Assistant

## 2021-07-22 ENCOUNTER — Other Ambulatory Visit: Payer: Self-pay

## 2021-07-22 VITALS — BP 111/75 | HR 99

## 2021-07-22 DIAGNOSIS — Z3042 Encounter for surveillance of injectable contraceptive: Secondary | ICD-10-CM | POA: Diagnosis not present

## 2021-07-22 MED ORDER — MEDROXYPROGESTERONE ACETATE 150 MG/ML IM SUSP
150.0000 mg | Freq: Once | INTRAMUSCULAR | Status: AC
  Administered 2021-07-22: 150 mg via INTRAMUSCULAR

## 2021-07-22 NOTE — Progress Notes (Signed)
? ?Established Patient Office Visit ? ?Subjective:  ?Patient ID: Elizabeth [name], female    DOB: 1995/05/24  Age: 26 y.o. MRN: [id number] ? ?CC:  ?Chief Complaint  ?Patient presents with  ? Contraception  ? ? ?HPI ?Elizabeth [name] is here for a Depo Provera injection. Denies chest pain, shortness of breath, headaches, mood changes or problems with medication. It has been < 14 weeks since her last Depo Provera injection.  ? ?Past Medical History:  ?Diagnosis Date  ? Cleft palate and cleft lip   ? DI (diabetes insipidus) ([place])   ? ? ?Past Surgical History:  ?Procedure Laterality Date  ? MENISCUS REPAIR Right 2012  ? ? ?Family History  ?Problem Relation Age of Onset  ? Alcoholism Maternal Grandfather   ? Cancer Maternal Grandmother   ? Heart attack Maternal Grandmother   ? Diabetes Maternal Grandmother   ?     paternal grandfather  ? ? ?Social History  ? ?Socioeconomic History  ? Marital status: Single  ?  Spouse name: Not on file  ? Number of children: Not on file  ? Years of education: Not on file  ? Highest education level: Not on file  ?Occupational History  ? Not on file  ?Tobacco Use  ? Smoking status: Never  ? Smokeless tobacco: Never  ?Substance and Sexual Activity  ? Alcohol use: Not on file  ? Drug use: No  ? Sexual activity: Never  ?Other Topics Concern  ? Not on file  ?Social History Narrative  ? Not on file  ? ?Social Determinants of Health  ? ?Financial Resource Strain: Not on file  ?Food Insecurity: Not on file  ?Transportation Needs: Not on file  ?Physical Activity: Not on file  ?Stress: Not on file  ?Social Connections: Not on file  ?Intimate Partner Violence: Not on file  ? ? ?Outpatient Medications Prior to Visit  ?Medication Sig Dispense Refill  ? AMBULATORY NON FORMULARY MEDICATION CBD Oil    ? atorvastatin (LIPITOR) 10 MG tablet Take 1 tablet (10 mg total) by mouth daily. 90 tablet 3  ? cannabidiol (EPIDIOLEX) 100 MG/ML solution Take 1.3 mLs (130 mg total) by mouth 2 (two) times daily. 300 mL 2  ?  desmopressin (DDAVP) 0.2 MG tablet Take 0.2 mg by mouth daily. Take two tablets by mouth three times a day    ? dorzolamide (TRUSOPT) 2 % ophthalmic solution INSTILL 1 DROP IN BOTH EYES THREE TIMES DAILY 30 mL 0  ? dorzolamide-timolol (COSOPT) 22.3-6.8 MG/ML ophthalmic solution INSTILL 1 DROP IN BOTH EYES TWICE DAILY 10 mL 1  ? folic acid (FOLVITE) 1 MG tablet TAKE 1 TABLET BY MOUTH EVERY DAY 90 tablet 3  ? Incontinence Supply Disposable (PREVAIL ADJ UNDERWEAR SM/MED) MISC Change underwear as needed. Fax to [phone] each prn  ? LamoTRIgine (LAMICTAL PO) Take by mouth. Take 300 mg in am and take 200 mg in the pm    ? levETIRAcetam (KEPPRA) 500 MG tablet Take 2 tablets (1,000 mg total) by mouth 2 (two) times daily. 360 tablet 3  ? Melatonin 5 MG CAPS Take by mouth.    ? Rufinamide (BANZEL PO) Take by mouth. Take 1600 mg two times a day    ? timolol (TIMOPTIC) 0.5 % ophthalmic solution Place 1 drop into both eyes 2 (two) times daily. 10 mL 1  ? Topiramate (TOPAMAX PO) Take 300 mg by mouth.    ? AMBULATORY NON FORMULARY MEDICATION Take 1 drop by mouth 2 (two) times daily.  CBD oil 1 dropplet twice a day.    ? ?No facility-administered medications prior to visit.  ? ? ?Allergies  ?Allergen Reactions  ? Antihistamines, Diphenhydramine-Type Other (See Comments)  ?  Per mom all antihistamines seem to counteract with diabetes meds  ? ? ?ROS ?Review of Systems ? ?  ?Objective:  ?  ?Physical Exam ? ?BP 111/75   Pulse 99   LMP  (LMP Unknown)   SpO2 100%  ?Wt Readings from Last 3 Encounters:  ?05/06/21 117 lb (53.1 kg)  ?12/28/20 117 lb (53.1 kg)  ?12/29/19 113 lb 12.8 oz (51.6 kg)  ? ? ? ?Health Maintenance Due  ?Topic Date Due  ? HIV Screening  Never done  ? Hepatitis C Screening  Never done  ? INFLUENZA VACCINE  12/20/2020  ? ? ?There are no preventive care reminders to display for this patient. ? ?No results found for: TSH ?Lab Results  ?Component Value Date  ? WBC 5.8 02/02/2021  ? HGB 13.2 02/02/2021  ? HCT  39.8 02/02/2021  ? MCV 90.7 02/02/2021  ? PLT 224 02/02/2021  ? ?Lab Results  ?Component Value Date  ? NA 143 02/02/2021  ? K 3.7 02/02/2021  ? CO2 23 02/02/2021  ? GLUCOSE 87 02/02/2021  ? BUN 19 02/02/2021  ? CREATININE 1.08 (H) 02/02/2021  ? BILITOT 0.3 02/02/2021  ? AST 16 02/02/2021  ? ALT 18 02/02/2021  ? PROT 7.4 02/02/2021  ? CALCIUM 9.3 02/02/2021  ? EGFR 73 02/02/2021  ? ?Lab Results  ?Component Value Date  ? CHOL 236 (H) 02/02/2021  ? ?Lab Results  ?Component Value Date  ? HDL 63 02/02/2021  ? ?Lab Results  ?Component Value Date  ? LDLCALC 157 (H) 02/02/2021  ? ?Lab Results  ?Component Value Date  ? TRIG 63 02/02/2021  ? ?Lab Results  ?Component Value Date  ? CHOLHDL 3.7 02/02/2021  ? ?No results found for: HGBA1C ? ?  ?Assessment & Plan:  ?Contraception - Patient tolerated injection well without complications. Patient advised to schedule next injection between May 19 through June 2.   ? ?Problem List Items Addressed This Visit   ?None ?Visit Diagnoses   ? ? Encounter for surveillance of injectable contraceptive    -  Primary  ? Relevant Medications  ? medroxyPROGESTERone (DEPO-PROVERA) injection 150 mg (Completed) (Start on 07/22/2021 10:00 AM)  ? ?  ? ? ?Meds ordered this encounter  ?Medications  ? medroxyPROGESTERone (DEPO-PROVERA) injection 150 mg  ? ? ?Follow-up: Return in about 12 weeks (around 10/14/2021) for Depo Provera injection. .  ? ? ?Lavell Luster, [place] ?

## 2021-07-22 NOTE — Progress Notes (Signed)
Agree with above plan. 

## 2021-09-28 ENCOUNTER — Other Ambulatory Visit: Payer: Self-pay | Admitting: Physician Assistant

## 2021-09-28 DIAGNOSIS — H409 Unspecified glaucoma: Secondary | ICD-10-CM

## 2021-10-07 ENCOUNTER — Ambulatory Visit (INDEPENDENT_AMBULATORY_CARE_PROVIDER_SITE_OTHER): Payer: Medicaid Other | Admitting: Family Medicine

## 2021-10-07 VITALS — Wt 117.0 lb

## 2021-10-07 DIAGNOSIS — Z3042 Encounter for surveillance of injectable contraceptive: Secondary | ICD-10-CM

## 2021-10-07 MED ORDER — MEDROXYPROGESTERONE ACETATE 150 MG/ML IM SUSP
150.0000 mg | Freq: Once | INTRAMUSCULAR | Status: AC
  Administered 2021-10-07: 150 mg via INTRAMUSCULAR

## 2021-10-07 NOTE — Progress Notes (Signed)
Pt is here for a Depo Provera injection.   Denies chest pain, shortness of breath, headaches, mood changes or problems with medication.  She is within injection window.  Tolerated injection today in [place] well, no immediate complication.  Advised to scheduled next depo appointment.  Informed to RTC between 8/3-8/17 for next injection. No further questions or concerns.

## 2021-10-07 NOTE — Progress Notes (Signed)
Agree with documentation as above.   Symir Mah, MD  

## 2021-11-29 ENCOUNTER — Other Ambulatory Visit: Payer: Self-pay | Admitting: Physician Assistant

## 2021-11-29 MED ORDER — TRAZODONE HCL 50 MG PO TABS: 25.0000 mg | ORAL_TABLET | Freq: Every evening | ORAL | 1 refills | Status: DC | PRN

## 2021-11-29 NOTE — Telephone Encounter (Signed)
Call care giver:  Message that Elizabeth [name] is not staying asleep at night. The only medication change would be topamax. I think adding that back in could be something to try or we could give a sleep aid trazodone to see if help stay asleep. Thoughts?

## 2021-11-29 NOTE — Telephone Encounter (Signed)
Mom okay with Trazodone. Please send to pharmacy.

## 2021-11-29 NOTE — Telephone Encounter (Signed)
Patient's mother made aware, she will think it over and let us know.

## 2021-12-15 ENCOUNTER — Telehealth: Payer: Self-pay

## 2021-12-15 ENCOUNTER — Ambulatory Visit: Payer: Medicaid Other | Admitting: Sports Medicine

## 2021-12-15 DIAGNOSIS — L918 Other hypertrophic disorders of the skin: Secondary | ICD-10-CM | POA: Insufficient documentation

## 2021-12-15 MED ORDER — TRIAZOLAM 0.25 MG PO TABS: ORAL_TABLET | ORAL | 0 refills | Status: DC

## 2021-12-15 NOTE — Telephone Encounter (Addendum)
Initiated Prior authorization [id number] 0.'25MG'$  tablets Via: [facility] FAX ONLY Case/Key:[id number] Status: approved  as of 12/12/21 Reason:This medication is approved from 12/15/21-01/14/22  Notified Pt via: Mychart

## 2021-12-15 NOTE — Assessment & Plan Note (Signed)
26 year old female, nonverbal with history of Lennox-Gastaut syndrome, mother has noticed a fleshy skin lesion left posterior scalp, tends to bother the patient, she has pulled out her hair around this lesion. This appears to be skin tags we performed cryotherapy today, we will bring her back in a month for repeat treatment if needed with triazolam preprocedural anxiolysis.

## 2021-12-15 NOTE — Progress Notes (Signed)
    Procedures performed today:    Procedure:  Cryodestruction of left posterior scalp acrochordon Consent obtained and verified. Time-out conducted. Noted no overlying erythema, induration, or other signs of local infection. Completed without difficulty using Cryo-Gun. Advised to call if fevers/chills, erythema, induration, drainage, or persistent bleeding.  Independent interpretation of notes and tests performed by another provider:   None.  Brief History, Exam, Impression, and Recommendations:    Skin tag 26 year old female, nonverbal with history of Lennox-Gastaut syndrome, mother has noticed a fleshy skin lesion left posterior scalp, tends to bother the patient, she has pulled out her hair around this lesion. This appears to be skin tags we performed cryotherapy today, we will bring her back in a month for repeat treatment if needed with triazolam preprocedural anxiolysis.    ____________________________________________ Gwen Her. Dianah Field, M.D., ABFM., CAQSM., AME. Primary Care and Sports Medicine [facility]  Adjunct Professor of [employer]  Risk manager

## 2021-12-30 ENCOUNTER — Encounter: Payer: Self-pay | Admitting: Physician Assistant

## 2021-12-30 ENCOUNTER — Ambulatory Visit (INDEPENDENT_AMBULATORY_CARE_PROVIDER_SITE_OTHER): Payer: Medicaid Other | Admitting: Physician Assistant

## 2021-12-30 VITALS — BP 111/65 | HR 104 | Ht 64.0 in | Wt 114.0 lb

## 2021-12-30 DIAGNOSIS — Z131 Encounter for screening for diabetes mellitus: Secondary | ICD-10-CM | POA: Diagnosis not present

## 2021-12-30 DIAGNOSIS — Z23 Encounter for immunization: Secondary | ICD-10-CM

## 2021-12-30 DIAGNOSIS — R32 Unspecified urinary incontinence: Secondary | ICD-10-CM

## 2021-12-30 DIAGNOSIS — Z0000 Encounter for general adult medical examination without abnormal findings: Secondary | ICD-10-CM

## 2021-12-30 DIAGNOSIS — Z13220 Encounter for screening for lipoid disorders: Secondary | ICD-10-CM | POA: Diagnosis not present

## 2021-12-30 DIAGNOSIS — Z3042 Encounter for surveillance of injectable contraceptive: Secondary | ICD-10-CM

## 2021-12-30 DIAGNOSIS — H409 Unspecified glaucoma: Secondary | ICD-10-CM

## 2021-12-30 DIAGNOSIS — Z1329 Encounter for screening for other suspected endocrine disorder: Secondary | ICD-10-CM

## 2021-12-30 DIAGNOSIS — E7800 Pure hypercholesterolemia, unspecified: Secondary | ICD-10-CM | POA: Diagnosis not present

## 2021-12-30 MED ORDER — TIMOLOL MALEATE 0.5 % OP SOLN: 1.0000 [drp] | Freq: Two times a day (BID) | OPHTHALMIC | 3 refills | Status: DC

## 2021-12-30 MED ORDER — DOXYCYCLINE HYCLATE 100 MG PO TABS: 100.0000 mg | ORAL_TABLET | Freq: Two times a day (BID) | ORAL | 0 refills | Status: DC

## 2021-12-30 MED ORDER — MEDROXYPROGESTERONE ACETATE 150 MG/ML IM SUSP
150.0000 mg | Freq: Once | INTRAMUSCULAR | Status: AC
  Administered 2021-12-30: 150 mg via INTRAMUSCULAR

## 2021-12-30 NOTE — Patient Instructions (Signed)
Health Maintenance, Female Adopting a healthy lifestyle and getting preventive care are important in promoting health and wellness. Ask your health care provider about: The right schedule for you to have regular tests and exams. Things you can do on your own to prevent diseases and keep yourself healthy. What should I know about diet, weight, and exercise? Eat a healthy diet  Eat a diet that includes plenty of vegetables, fruits, low-fat dairy products, and lean protein. Do not eat a lot of foods that are high in solid fats, added sugars, or sodium. Maintain a healthy weight Body mass index (BMI) is used to identify weight problems. It estimates body fat based on height and weight. Your health care provider can help determine your BMI and help you achieve or maintain a healthy weight. Get regular exercise Get regular exercise. This is one of the most important things you can do for your health. Most adults should: Exercise for at least 150 minutes each week. The exercise should increase your heart rate and make you sweat (moderate-intensity exercise). Do strengthening exercises at least twice a week. This is in addition to the moderate-intensity exercise. Spend less time sitting. Even light physical activity can be beneficial. Watch cholesterol and blood lipids Have your blood tested for lipids and cholesterol at 28 years of age, then have this test every 5 years. Have your cholesterol levels checked more often if: Your lipid or cholesterol levels are high. You are older than 28 years of age. You are at high risk for heart disease. What should I know about cancer screening? Depending on your health history and family history, you may need to have cancer screening at various ages. This may include screening for: Breast cancer. Cervical cancer. Colorectal cancer. Skin cancer. Lung cancer. What should I know about heart disease, diabetes, and high blood pressure? Blood pressure and heart  disease High blood pressure causes heart disease and increases the risk of stroke. This is more likely to develop in people who have high blood pressure readings or are overweight. Have your blood pressure checked: Every 3-5 years if you are 18-39 years of age. Every year if you are 40 years old or older. Diabetes Have regular diabetes screenings. This checks your fasting blood sugar level. Have the screening done: Once every three years after age 40 if you are at a normal weight and have a low risk for diabetes. More often and at a younger age if you are overweight or have a high risk for diabetes. What should I know about preventing infection? Hepatitis B If you have a higher risk for hepatitis B, you should be screened for this virus. Talk with your health care provider to find out if you are at risk for hepatitis B infection. Hepatitis C Testing is recommended for: Everyone born from 1945 through 1965. Anyone with known risk factors for hepatitis C. Sexually transmitted infections (STIs) Get screened for STIs, including gonorrhea and chlamydia, if: You are sexually active and are younger than 28 years of age. You are older than 28 years of age and your health care provider tells you that you are at risk for this type of infection. Your sexual activity has changed since you were last screened, and you are at increased risk for chlamydia or gonorrhea. Ask your health care provider if you are at risk. Ask your health care provider about whether you are at high risk for HIV. Your health care provider may recommend a prescription medicine to help prevent HIV   infection. If you choose to take medicine to prevent HIV, you should first get tested for HIV. You should then be tested every 3 months for as long as you are taking the medicine. Pregnancy If you are about to stop having your period (premenopausal) and you may become pregnant, seek counseling before you get pregnant. Take 400 to 800  micrograms (mcg) of folic acid every day if you become pregnant. Ask for birth control (contraception) if you want to prevent pregnancy. Osteoporosis and menopause Osteoporosis is a disease in which the bones lose minerals and strength with aging. This can result in bone fractures. If you are 34 years old or older, or if you are at risk for osteoporosis and fractures, ask your health care provider if you should: Be screened for bone loss. Take a calcium or vitamin D supplement to lower your risk of fractures. Be given hormone replacement therapy (HRT) to treat symptoms of menopause. Follow these instructions at home: Alcohol use Do not drink alcohol if: Your health care provider tells you not to drink. You are pregnant, may be pregnant, or are planning to become pregnant. If you drink alcohol: Limit how much you have to: 0-1 drink a day. Know how much alcohol is in your drink. In the [place] one drink equals one 12 oz bottle of beer (355 mL), one 5 oz glass of wine (148 mL), or one 1 oz glass of hard liquor (44 mL). Lifestyle Do not use any products that contain nicotine or tobacco. These products include cigarettes, chewing tobacco, and vaping devices, such as e-cigarettes. If you need help quitting, ask your health care provider. Do not use street drugs. Do not share needles. Ask your health care provider for help if you need support or information about quitting drugs. General instructions Schedule regular health, dental, and eye exams. Stay current with your vaccines. Tell your health care provider if: You often feel depressed. You have ever been abused or do not feel safe at home. Summary Adopting a healthy lifestyle and getting preventive care are important in promoting health and wellness. Follow your health care provider's instructions about healthy diet, exercising, and getting tested or screened for diseases. Follow your health care provider's instructions on monitoring your  cholesterol and blood pressure. This information is not intended to replace advice given to you by your health care provider. Make sure you discuss any questions you have with your health care provider. Document Revised: 09/27/2020 Document Reviewed: 09/27/2020 Elsevier Patient Education  [place]. Cellulitis, Adult  Cellulitis is a skin infection. The infected area is usually warm, red, swollen, and tender. This condition occurs most often in the arms and lower legs. The infection can travel to the muscles, blood, and underlying tissue and become serious. It is very important to get treated for this condition. What are the causes? Cellulitis is caused by bacteria. The bacteria enter through a break in the skin, such as a cut, burn, insect bite, open sore, or crack. What increases the risk? This condition is more likely to occur in people who: Have a weak body defense system (immune system). Have open wounds on the skin, such as cuts, burns, bites, and scrapes. Bacteria can enter the body through these open wounds. Are older than 28 years of age. Have diabetes. Have a type of long-lasting (chronic) liver disease (cirrhosis) or kidney disease. Are obese. Have a skin condition such as: Itchy rash (eczema). Slow movement of blood in the veins (venous stasis). Fluid buildup  below the skin (edema). Have had radiation therapy. Use IV drugs. What are the signs or symptoms? Symptoms of this condition include: Redness, streaking, or spotting on the skin. Swollen area of the skin. Tenderness or pain when an area of the skin is touched. Warm skin. A fever. Chills. Blisters. How is this diagnosed? This condition is diagnosed based on a medical history and physical exam. You may also have tests, including: Blood tests. Imaging tests. How is this treated? Treatment for this condition may include: Medicines, such as antibiotic medicines or medicines to treat allergies  (antihistamines). Supportive care, such as rest and application of cold or warm cloths (compresses) to the skin. Hospital care, if the condition is severe. The infection usually starts to get better within 1-2 days of treatment. Follow these instructions at home:  Medicines Take over-the-counter and prescription medicines only as told by your health care provider. If you were prescribed an antibiotic medicine, take it as told by your health care provider. Do not stop taking the antibiotic even if you start to feel better. General instructions Drink enough fluid to keep your urine pale yellow. Do not touch or rub the infected area. Raise (elevate) the infected area above the level of your heart while you are sitting or lying down. Apply warm or cold compresses to the affected area as told by your health care provider. Keep all follow-up visits as told by your health care provider. This is important. These visits let your health care provider make sure a more serious infection is not developing. Contact a health care provider if: You have a fever. Your symptoms do not begin to improve within 1-2 days of starting treatment. Your bone or joint underneath the infected area becomes painful after the skin has healed. Your infection returns in the same area or another area. You notice a swollen bump in the infected area. You develop new symptoms. You have a general ill feeling (malaise) with muscle aches and pains. Get help right away if: Your symptoms get worse. You feel very sleepy. You develop vomiting or diarrhea that persists. You notice red streaks coming from the infected area. Your red area gets larger or turns dark in color. These symptoms may represent a serious problem that is an emergency. Do not wait to see if the symptoms will go away. Get medical help right away. Call your local emergency services (911 in the [place] Do not drive yourself to the hospital. Summary Cellulitis is a  skin infection. This condition occurs most often in the arms and lower legs. Treatment for this condition may include medicines, such as antibiotic medicines or antihistamines. Take over-the-counter and prescription medicines only as told by your health care provider. If you were prescribed an antibiotic medicine, do not stop taking the antibiotic even if you start to feel better. Contact a health care provider if your symptoms do not begin to improve within 1-2 days of starting treatment or your symptoms get worse. Keep all follow-up visits as told by your health care provider. This is important. These visits let your health care provider make sure that a more serious infection is not developing. This information is not intended to replace advice given to you by your health care provider. Make sure you discuss any questions you have with your health care provider. Document Revised: 02/16/2021 Document Reviewed: 02/17/2021 Elsevier Patient Education  [place].

## 2021-12-30 NOTE — Progress Notes (Addendum)
Complete physical exam  Patient: Elizabeth [name]   DOB: 12/02/1995   26 y.o. Female  MRN: [id number]  Subjective:    Chief Complaint  Patient presents with   Annual Exam    Elizabeth [name] is a 26 y.o. female who presents today for a complete physical exam. She reports consuming a general diet. The patient does not participate in regular exercise at present. She generally feels fairly well. She reports sleeping she sleeps better on trazodone but caregiver is not giving her trazodone nightly. She does have additional problems to discuss today.   She has a drainage abscess of right calf for the last week. Tried warm compresses.    Pt continues to wear diapers during the day and at night for urinary incontinence.       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   Intellectual disability 08/18/2014   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   Incontinence 08/18/2014   Glaucoma 08/18/2014   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   Past Medical History:  Diagnosis Date   Cleft palate and cleft lip    DI (diabetes insipidus) ([place])    Family History  Problem Relation Age of Onset   Alcoholism Maternal Grandfather    Cancer Maternal Grandmother    Heart attack Maternal Grandmother    Diabetes Maternal Grandmother        paternal grandfather   Allergies  Allergen Reactions   Antihistamines, Diphenhydramine-Type Other (See Comments)    Per mom all  antihistamines seem to counteract with diabetes meds      Patient Care Team: Lavada Mesi as PCP - General (Family Medicine)   Outpatient Medications Prior to Visit  Medication Sig   atorvastatin (LIPITOR) 10 MG tablet Take 1 tablet (10 mg total) by mouth daily.   cannabidiol (EPIDIOLEX) 100 MG/ML solution Take 1.3 mLs (130 mg total) by mouth 2 (two) times daily.   desmopressin (DDAVP) 0.2 MG tablet Take 0.2 mg by mouth daily. Take two tablets by mouth three times a day   dorzolamide (TRUSOPT) 2 % ophthalmic solution INSTILL 1 DROP IN BOTH EYES THREE TIMES DAILY   folic acid (FOLVITE) 1 MG tablet TAKE 1 TABLET BY MOUTH EVERY DAY   Incontinence Supply Disposable (PREVAIL ADJ UNDERWEAR SM/MED) MISC Change underwear as needed. Fax to [phone]   LamoTRIgine (LAMICTAL PO) Take by mouth. Take 300 mg in am and take 200 mg in the pm   levETIRAcetam (KEPPRA) 500 MG tablet Take 2 tablets (1,000 mg total) by mouth 2 (two) times daily.   Melatonin 5 MG CAPS Take by mouth.   Rufinamide (BANZEL PO) Take by mouth. Take 1600 mg two times a day   timolol (TIMOPTIC) 0.5 % ophthalmic solution Place 1 drop into both eyes 2 (two) times daily.   traZODone (DESYREL) 50 MG tablet Take 0.5-1 tablets (25-50 mg total) by mouth at bedtime as needed for sleep.   triazolam (HALCION) 0.25 MG  tablet 1-2 tabs PO 2 hours before procedure or imaging.  Do not drive with this medication.   [DISCONTINUED] Topiramate (TOPAMAX PO) Take 300 mg by mouth.   [DISCONTINUED] AMBULATORY NON FORMULARY MEDICATION CBD Oil   [DISCONTINUED] dorzolamide-timolol (COSOPT) 22.3-6.8 MG/ML ophthalmic solution INSTILL 1 DROP IN BOTH EYES TWICE DAILY   No facility-administered medications prior to visit.    ROS        Objective:     BP 111/65   Pulse (!) 104   Ht '5\' 4"'  (1.626 m)   Wt 114 lb (51.7 kg)   BMI 19.57 kg/m  BP Readings from Last 3 Encounters:  12/30/21 111/65  07/22/21 111/75  12/28/20 109/64   Wt  Readings from Last 3 Encounters:  12/30/21 114 lb (51.7 kg)  10/07/21 117 lb (53.1 kg)  05/06/21 117 lb (53.1 kg)      Physical Exam  BP 111/65   Pulse (!) 104   Ht '5\' 4"'  (1.626 m)   Wt 114 lb (51.7 kg)   BMI 19.57 kg/m   General Appearance:    Alert no distress  Head:    Normocephalic, without obvious abnormality, atraumatic     Ears:    Normal TM's and external ear canals, both ears  Nose:   Nares normal, septum midline, mucosa normal, no drainage    or sinus tenderness  Throat:   Lips, mucosa, and tongue normal; teeth and gums normal  Neck:   Supple, symmetrical, trachea midline, no adenopathy;    thyroid:  no enlargement/tenderness/nodules; no carotid   bruit or JVD  Back:     Symmetric, no curvature, ROM normal, no CVA tenderness  Lungs:     Clear to auscultation bilaterally, respirations unlabored  Chest Wall:    No tenderness or deformity   Heart:    Regular rate and rhythm, S1 and S2 normal, no murmur, rub   or gallop     Abdomen:     Soft, non-tender, bowel sounds active all four quadrants,    no masses, no organomegaly        Extremities:   Extremities normal, atraumatic, no cyanosis or edema  Pulses:   2+ and symmetric all extremities  Skin:  Right calf abscess with active drainage and 4cm erythema with warmth and tenderness  Lymph nodes:   Cervical, supraclavicular, and axillary nodes normal  Neurologic:   Grossly intact      Assessment & Plan:    Routine Health Maintenance and Physical Exam  Immunization History  Administered Date(s) Administered   DTaP 07/04/1994, 10/24/1994, 01/12/1995, 10/30/1995, 04/28/1999   HPV 9-valent 10/17/2011   Hepatitis A 08/22/2005, 03/05/2006   Hepatitis A, Ped/Adol-2 Dose 08/22/2005, 03/05/2006   Hepatitis B 10/24/1994, 11/28/1994, 05/18/1995   Hepatitis B, ped/adol 10/24/1994, 11/28/1994, 05/18/1995   HiB (PRP-OMP) 07/04/1994, 10/24/1994, 01/12/1995, 10/30/1995   Hpv-Unspecified 10/17/2011   Influenza,inj,Quad PF,6+  Mos 04/28/1999   MMR 07/31/1995, 06/07/1999   Meningococcal Conjugate 08/27/2007   Meningococcal Polysaccharide 10/17/2011   Mumps 07/04/1994, 10/24/1994, 01/12/1995, 10/30/1995   PPD Test 10/30/2006, 11/17/2008   Td 08/22/2005, 10/17/2011   Varicella 04/28/1999    Health Maintenance  Topic Date Due   HIV Screening  Never done   HPV VACCINES (2 - Risk 3-dose series) 11/14/2011   Hepatitis C Screening  Never done   TETANUS/TDAP  10/16/2021   INFLUENZA VACCINE  12/20/2021   PAP-Cervical Cytology Screening  09/02/2029 (Originally 04/25/2015)   PAP SMEAR-Modifier  09/02/2029 (Originally 04/25/2015)    Discussed  health benefits of physical activity, and encouraged her to engage in regular exercise appropriate for her age and condition.  .. Discussed 150 minutes of exercise a week.  Encouraged vitamin D 1000 units and Calcium 1365m or 4 servings of dairy a day.  Fasting labs ordered Medications reviewed and refilled Doxycycline given for right leg cellulitis and abscess Symptomatic care discussed Discussed sleep and to continue trazodone Depo shot given follow up in 3 month Tdap given    JIran Planas PA-C

## 2021-12-31 LAB — COMPLETE METABOLIC PANEL WITH GFR
AG Ratio: 1.4 (calc) (ref 1.0–2.5)
ALT: 15 U/L (ref 6–29)
AST: 16 U/L (ref 10–30)
Albumin: 4.2 g/dL (ref 3.6–5.1)
Alkaline phosphatase (APISO): 94 U/L (ref 31–125)
BUN: 18 mg/dL (ref 7–25)
CO2: 28 mmol/L (ref 20–32)
Calcium: 9.5 mg/dL (ref 8.6–10.2)
Chloride: 108 mmol/L (ref 98–110)
Creat: 0.86 mg/dL (ref 0.50–0.96)
Globulin: 3 g/dL (calc) (ref 1.9–3.7)
Glucose, Bld: 88 mg/dL (ref 65–99)
Potassium: 4.5 mmol/L (ref 3.5–5.3)
Sodium: 144 mmol/L (ref 135–146)
Total Bilirubin: 0.4 mg/dL (ref 0.2–1.2)
Total Protein: 7.2 g/dL (ref 6.1–8.1)
eGFR: 95 mL/min/{1.73_m2} (ref >=60)

## 2021-12-31 LAB — CBC WITH DIFFERENTIAL/PLATELET
Absolute Monocytes: 791 cells/uL (ref 200–950)
Basophils Absolute: 34 cells/uL (ref 0–200)
Basophils Relative: 0.4 %
Eosinophils Absolute: 60 cells/uL (ref 15–500)
Eosinophils Relative: 0.7 %
HCT: 37.3 % (ref 35.0–45.0)
Hemoglobin: 12.4 g/dL (ref 11.7–15.5)
Lymphs Abs: 1615 cells/uL (ref 850–3900)
MCH: 30.3 pg (ref 27.0–33.0)
MCHC: 33.2 g/dL (ref 32.0–36.0)
MCV: 91.2 fL (ref 80.0–100.0)
MPV: 11.9 fL (ref 7.5–12.5)
Monocytes Relative: 9.3 %
Neutro Abs: 6001 cells/uL (ref 1500–7800)
Neutrophils Relative %: 70.6 %
Platelets: 237 10*3/uL (ref 140–400)
RBC: 4.09 10*6/uL (ref 3.80–5.10)
RDW: 11.7 % (ref 11.0–15.0)
Total Lymphocyte: 19 %
WBC: 8.5 10*3/uL (ref 3.8–10.8)

## 2021-12-31 LAB — LIPID PANEL W/REFLEX DIRECT LDL
Cholesterol: 156 mg/dL (ref <200)
HDL: 65 mg/dL (ref >=50)
LDL Cholesterol (Calc): 78 mg/dL (calc)
Non-HDL Cholesterol (Calc): 91 mg/dL (calc) (ref <130)
Total CHOL/HDL Ratio: 2.4 (calc) (ref <5.0)
Triglycerides: 53 mg/dL (ref <150)

## 2021-12-31 LAB — TSH: TSH: 1.82 mIU/L

## 2022-01-02 NOTE — Progress Notes (Signed)
Hemoglobin looks good.  Kidney, liver, glucose looks good.  Cholesterol looks great.  Thyroid looks great.

## 2022-01-04 ENCOUNTER — Other Ambulatory Visit: Payer: Self-pay | Admitting: Physician Assistant

## 2022-01-04 DIAGNOSIS — H409 Unspecified glaucoma: Secondary | ICD-10-CM

## 2022-01-12 ENCOUNTER — Ambulatory Visit: Payer: Medicaid Other | Admitting: Sports Medicine

## 2022-01-20 ENCOUNTER — Other Ambulatory Visit: Payer: Self-pay | Admitting: Physician Assistant

## 2022-01-26 MED ORDER — ATORVASTATIN CALCIUM 10 MG PO TABS: 10.0000 mg | ORAL_TABLET | Freq: Every day | ORAL | 3 refills | Status: DC

## 2022-03-24 ENCOUNTER — Ambulatory Visit (INDEPENDENT_AMBULATORY_CARE_PROVIDER_SITE_OTHER): Payer: Medicaid Other | Admitting: Physician Assistant

## 2022-03-24 VITALS — BP 96/73 | HR 116

## 2022-03-24 DIAGNOSIS — Z3042 Encounter for surveillance of injectable contraceptive: Secondary | ICD-10-CM

## 2022-03-24 MED ORDER — MEDROXYPROGESTERONE ACETATE 150 MG/ML IM SUSP
150.0000 mg | Freq: Once | INTRAMUSCULAR | Status: AC
  Administered 2022-03-24: 150 mg via INTRAMUSCULAR

## 2022-03-24 NOTE — Progress Notes (Signed)
Pt is here for a Depo Provera injection. Denies chest pain, shortness of breath, headaches, mood changes or problems with medication. She is within injection window. Tolerated injection today in RUOQ well, no immediate complication.   Advised to scheduled next depo appointment. Informed to RTC between 1/19-06/23/2022 for next injection. No further questions or concerns.

## 2022-03-24 NOTE — Progress Notes (Signed)
Agree with above plan. 

## 2022-03-31 ENCOUNTER — Telehealth: Payer: Self-pay | Admitting: Neurology

## 2022-03-31 DIAGNOSIS — G40813 Lennox-Gastaut syndrome, intractable, with status epilepticus: Secondary | ICD-10-CM

## 2022-03-31 NOTE — Telephone Encounter (Signed)
Patient's mother would like second opinion with Neurology that is closer to home. Referral placed.   [place].

## 2022-04-07 ENCOUNTER — Other Ambulatory Visit: Payer: Self-pay | Admitting: Neurology

## 2022-04-07 DIAGNOSIS — H409 Unspecified glaucoma: Secondary | ICD-10-CM

## 2022-04-07 MED ORDER — DORZOLAMIDE HCL 2 % OP SOLN: OPHTHALMIC | 3 refills | Status: DC

## 2022-04-18 ENCOUNTER — Other Ambulatory Visit: Payer: Self-pay | Admitting: Neurology

## 2022-04-18 MED ORDER — TRAZODONE HCL 50 MG PO TABS: ORAL_TABLET | ORAL | 0 refills | Status: DC

## 2022-05-01 ENCOUNTER — Other Ambulatory Visit: Payer: Self-pay | Admitting: Physician Assistant

## 2022-05-01 ENCOUNTER — Other Ambulatory Visit: Payer: Self-pay | Admitting: Neurology

## 2022-05-01 MED ORDER — TOPIRAMATE 100 MG PO TABS: 100.0000 mg | ORAL_TABLET | Freq: Every day | ORAL | 1 refills | Status: DC

## 2022-06-23 ENCOUNTER — Ambulatory Visit (INDEPENDENT_AMBULATORY_CARE_PROVIDER_SITE_OTHER): Payer: Medicaid Other | Admitting: Physician Assistant

## 2022-06-23 DIAGNOSIS — Z3042 Encounter for surveillance of injectable contraceptive: Secondary | ICD-10-CM | POA: Diagnosis not present

## 2022-06-23 MED ORDER — MEDROXYPROGESTERONE ACETATE 150 MG/ML IM SUSP
150.0000 mg | Freq: Once | INTRAMUSCULAR | Status: AC
  Administered 2022-06-23: 150 mg via INTRAMUSCULAR

## 2022-06-23 NOTE — Progress Notes (Signed)
Agree with above plan. 

## 2022-06-23 NOTE — Progress Notes (Signed)
Pt is here for a Depo Provera injection.   She is within injection window. Tolerated injection today in LUOQ well, no immediate complication. Advised to scheduled next depo   Informed to RTC between 4/26-09/23/2022. for next injection. No further questions or concerns.

## 2022-08-11 LAB — COMPREHENSIVE METABOLIC PANEL
Calcium: 9.7 (ref 8.7–10.7)
eGFR: 74

## 2022-08-11 LAB — BASIC METABOLIC PANEL
BUN: 19 (ref 4–21)
CO2: 23 — AB (ref 13–22)
Chloride: 114 — AB (ref 99–108)
Creatinine: 1.1 (ref 0.5–1.1)
Glucose: 94
Potassium: 3.4 mEq/L — AB (ref 3.5–5.1)
Sodium: 151 — AB (ref 137–147)

## 2022-08-11 LAB — OTHER LAB RESULT: BUN/Creatinine Ratio: 18

## 2022-08-28 ENCOUNTER — Telehealth: Payer: Self-pay

## 2022-08-28 DIAGNOSIS — H409 Unspecified glaucoma: Secondary | ICD-10-CM

## 2022-08-28 MED ORDER — TIMOLOL MALEATE 0.5 % OP SOLN: 1.0000 [drp] | Freq: Two times a day (BID) | OPHTHALMIC | 3 refills | Status: DC

## 2022-08-28 NOTE — Telephone Encounter (Signed)
Pt mother calls for med refill on timolol

## 2022-09-11 ENCOUNTER — Other Ambulatory Visit: Payer: Self-pay | Admitting: Physician Assistant

## 2022-09-15 ENCOUNTER — Ambulatory Visit (INDEPENDENT_AMBULATORY_CARE_PROVIDER_SITE_OTHER): Payer: Medicaid Other | Admitting: Physician Assistant

## 2022-09-15 VITALS — BP 93/80 | HR 95 | Wt 110.0 lb

## 2022-09-15 DIAGNOSIS — Z3042 Encounter for surveillance of injectable contraceptive: Secondary | ICD-10-CM

## 2022-09-15 MED ORDER — MEDROXYPROGESTERONE ACETATE 150 MG/ML IM SUSY
150.0000 mg | PREFILLED_SYRINGE | Freq: Once | INTRAMUSCULAR | Status: AC
  Administered 2022-09-15: 150 mg via INTRAMUSCULAR

## 2022-09-15 NOTE — Progress Notes (Signed)
Agree with above plan. 

## 2022-09-15 NOTE — Progress Notes (Signed)
Pt is here for a Depo Provera injection.   Denies chest pain, shortness of breath, headaches, mood changes or problems with medication.   She is within injection window. Tolerated injection today in LUOQ well, no immediate complication.  Advised to scheduled next depo appointment.   Informed to RTC between 7/12-7/26/2024 for next injection. No further questions or concerns.

## 2022-09-15 NOTE — Patient Instructions (Signed)
RTC between 7/12-7/26/2024 for next depo injection

## 2022-09-20 LAB — BASIC METABOLIC PANEL
BUN: 19 (ref 4–21)
CO2: 21 (ref 13–22)
Chloride: 112 — AB (ref 99–108)
Creatinine: 1.1 (ref 0.5–1.1)
Glucose: 85
Potassium: 4.2 mEq/L (ref 3.5–5.1)
Sodium: 149 — AB (ref 137–147)

## 2022-09-20 LAB — COMPREHENSIVE METABOLIC PANEL
Calcium: 9.5 (ref 8.7–10.7)
eGFR: 74

## 2022-09-28 ENCOUNTER — Encounter: Payer: Self-pay | Admitting: Physician Assistant

## 2022-09-29 IMAGING — DX DG ANKLE COMPLETE 3+V*L*
3 series · 3 of 3 positions shown · non-contrast
Comparison: Radiograph dated 10/27/2019 and 09/03/2019.

CLINICAL DATA: Fall.

EXAM:
LEFT ANKLE COMPLETE - 3+ VIEW; LEFT FOOT - COMPLETE 3+ VIEW

[ankle ap]
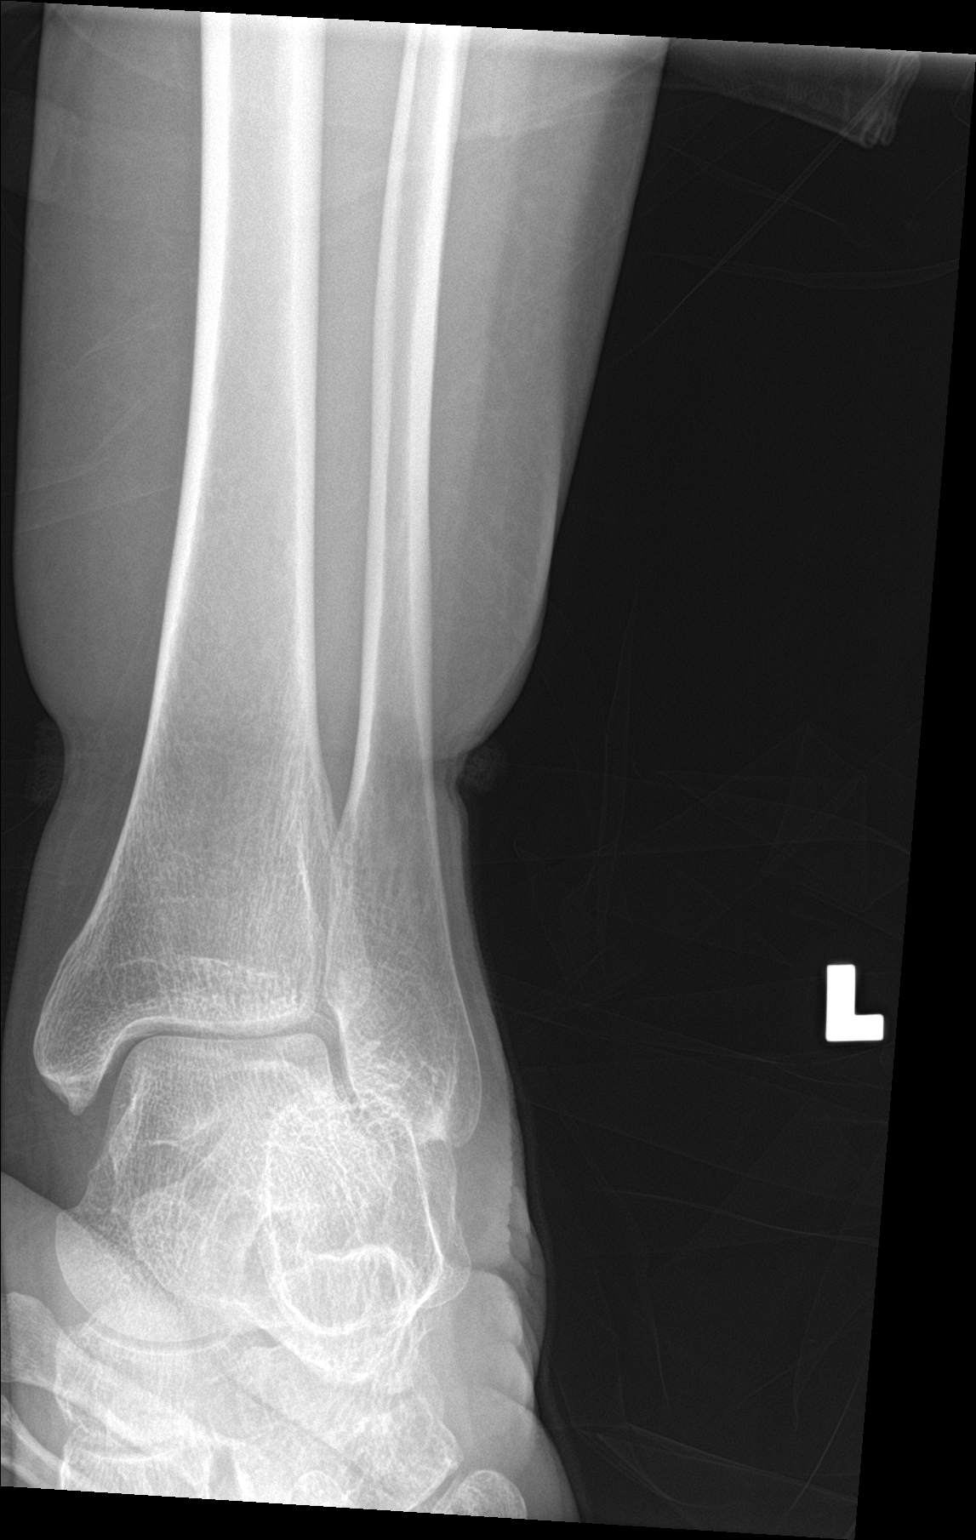

[ankle obl]
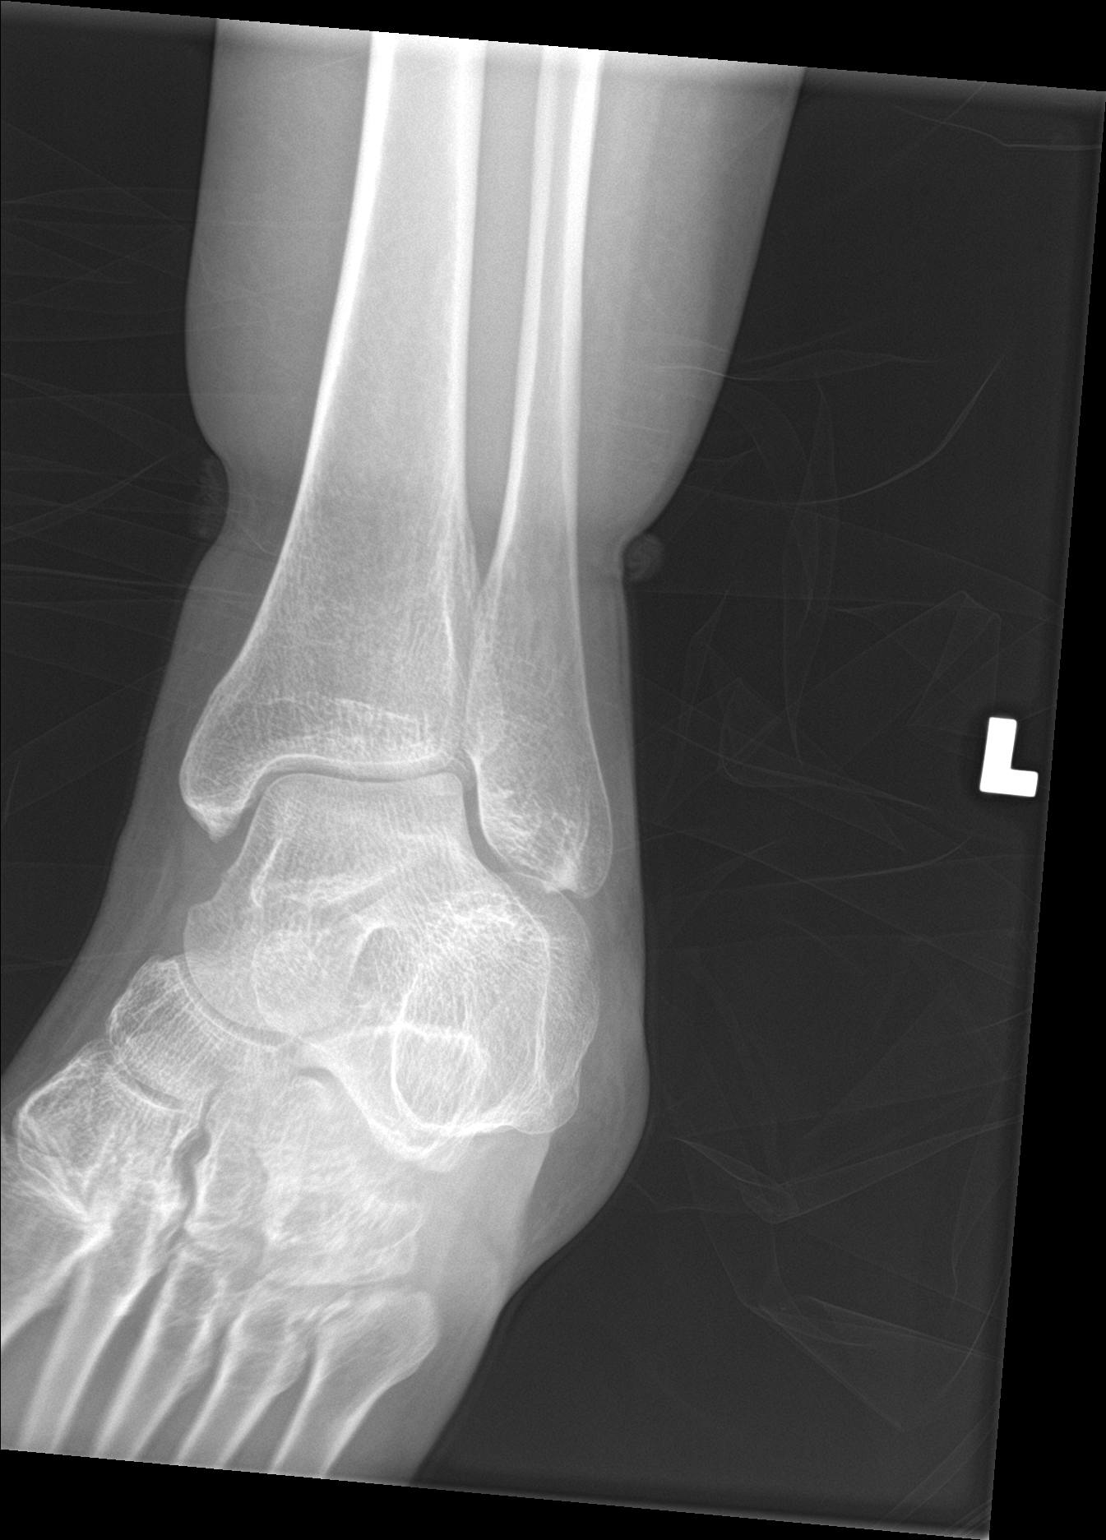

[ankle lat]
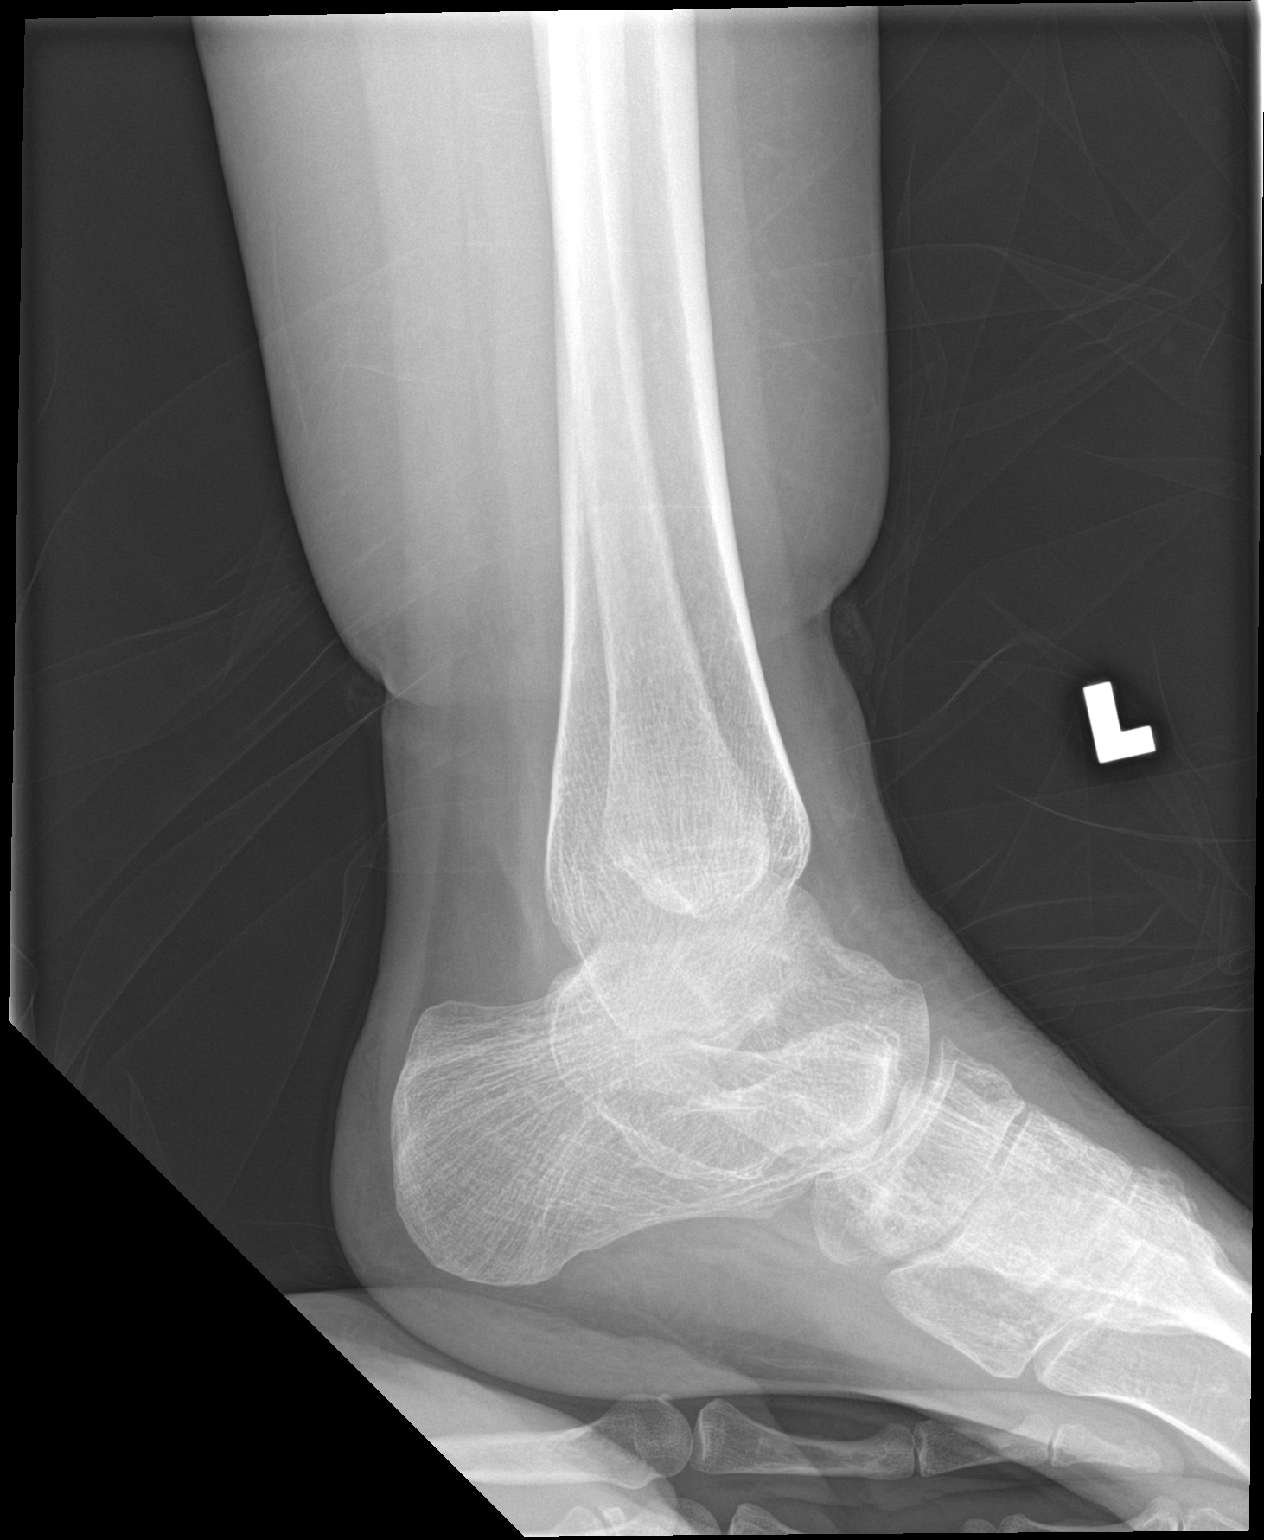

[3 of 3 positions shown; findings below may reference images not displayed]

FINDINGS: No acute fracture or dislocation. The ankle mortise is intact. The
bones are somewhat osteopenic for age. There is subcutaneous edema
of the leg.
IMPRESSION: 1. No acute fracture or dislocation.
2. Subcutaneous edema.

## 2022-09-29 IMAGING — DX DG FOOT COMPLETE 3+V*L*
3 series · 3 of 3 positions shown · non-contrast
Comparison: Radiograph dated 10/27/2019 and 09/03/2019.

CLINICAL DATA: Fall.

EXAM:
LEFT ANKLE COMPLETE - 3+ VIEW; LEFT FOOT - COMPLETE 3+ VIEW

[foot ap]
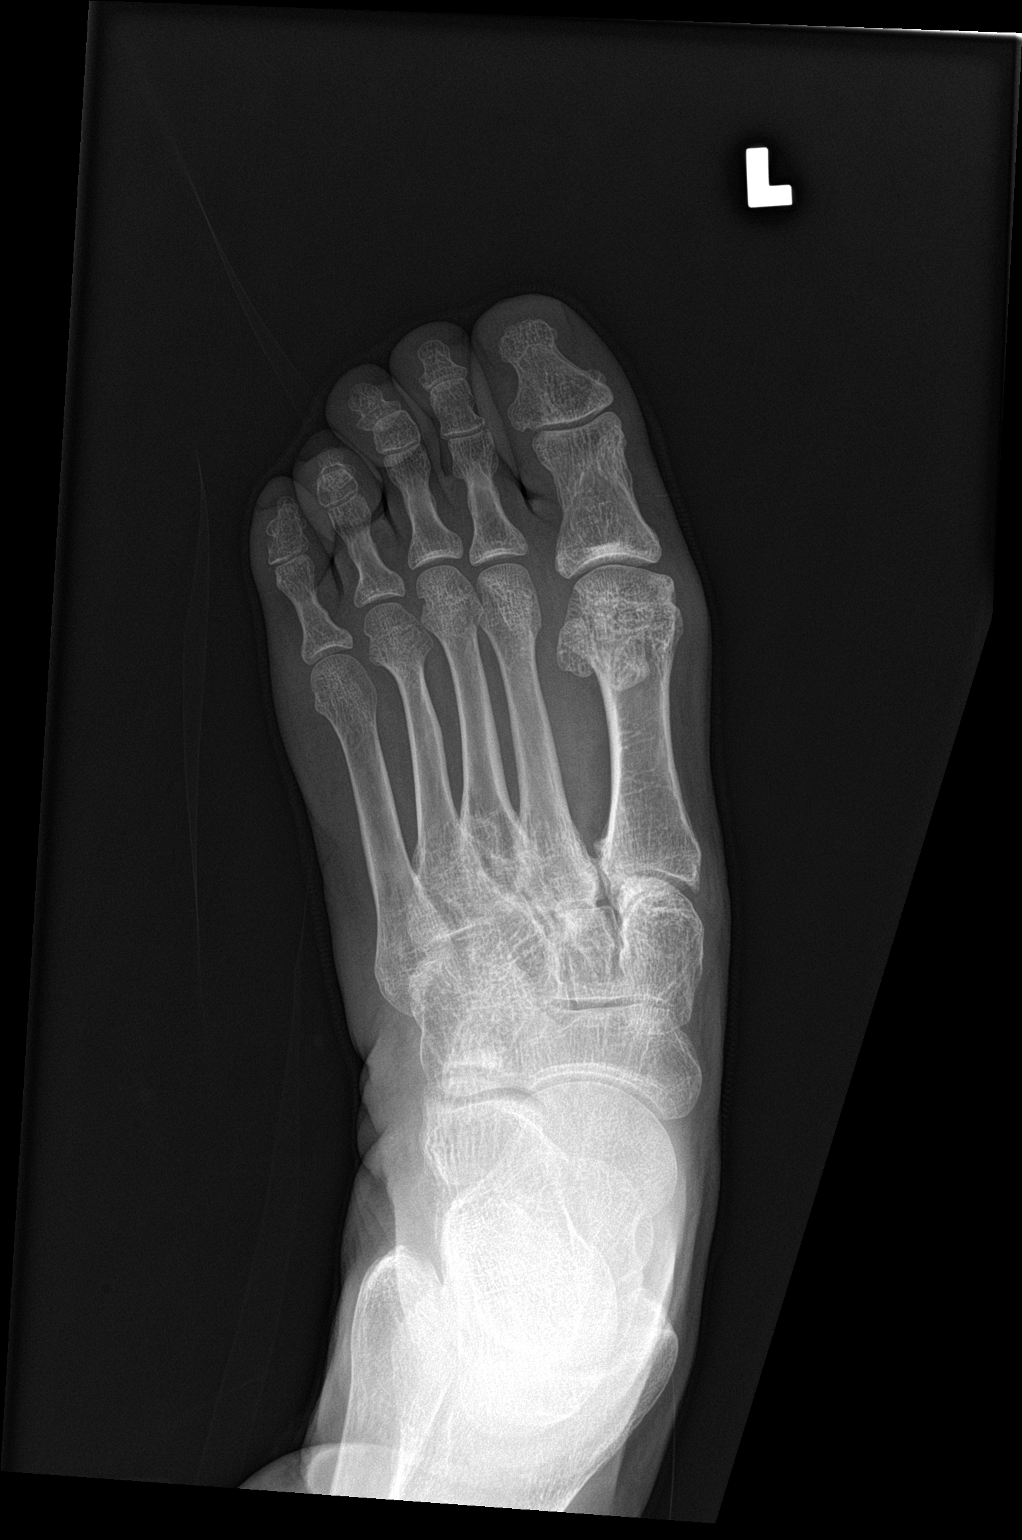

[foot obl]
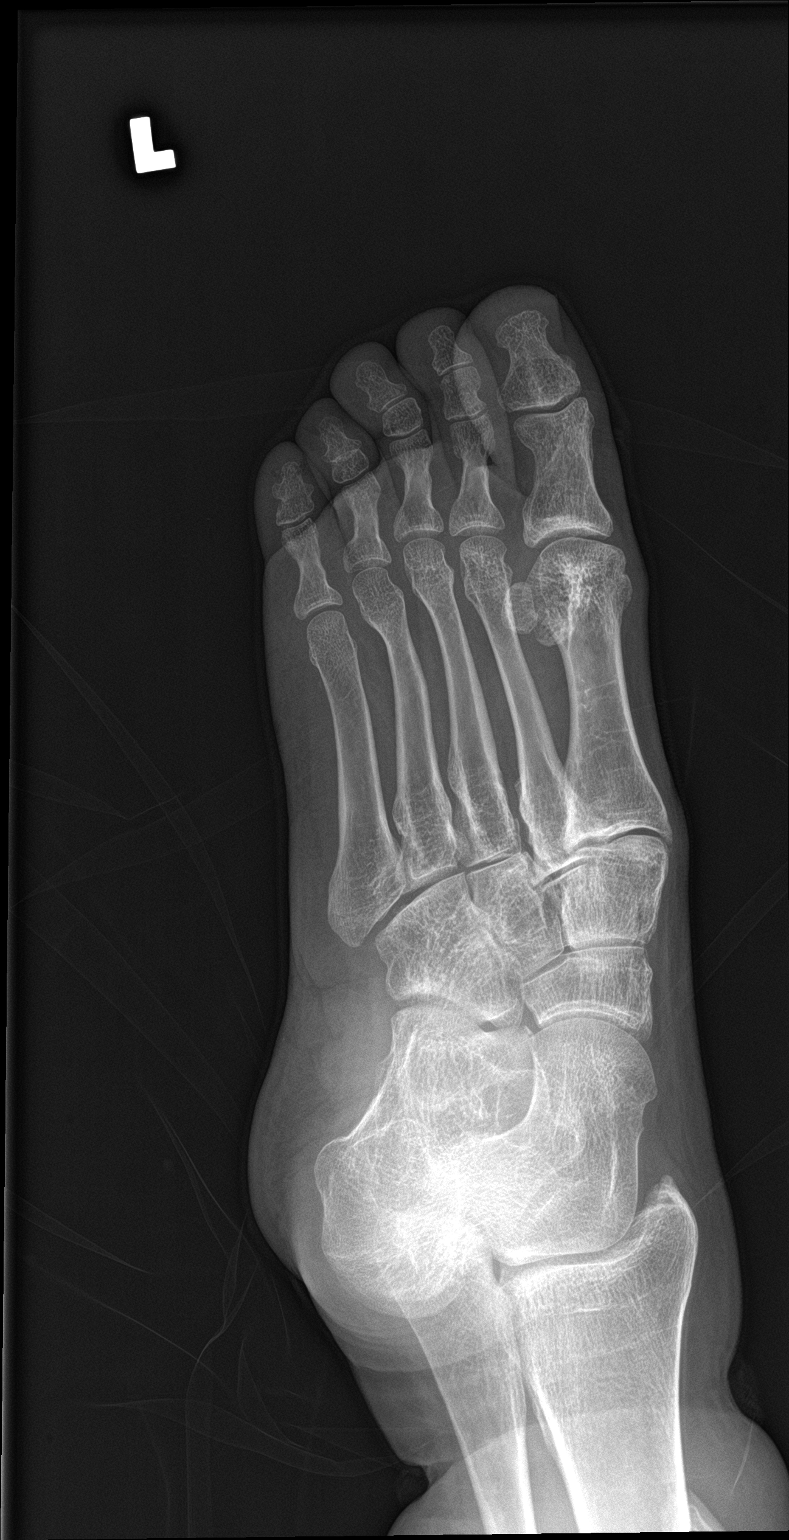

[foot lat]
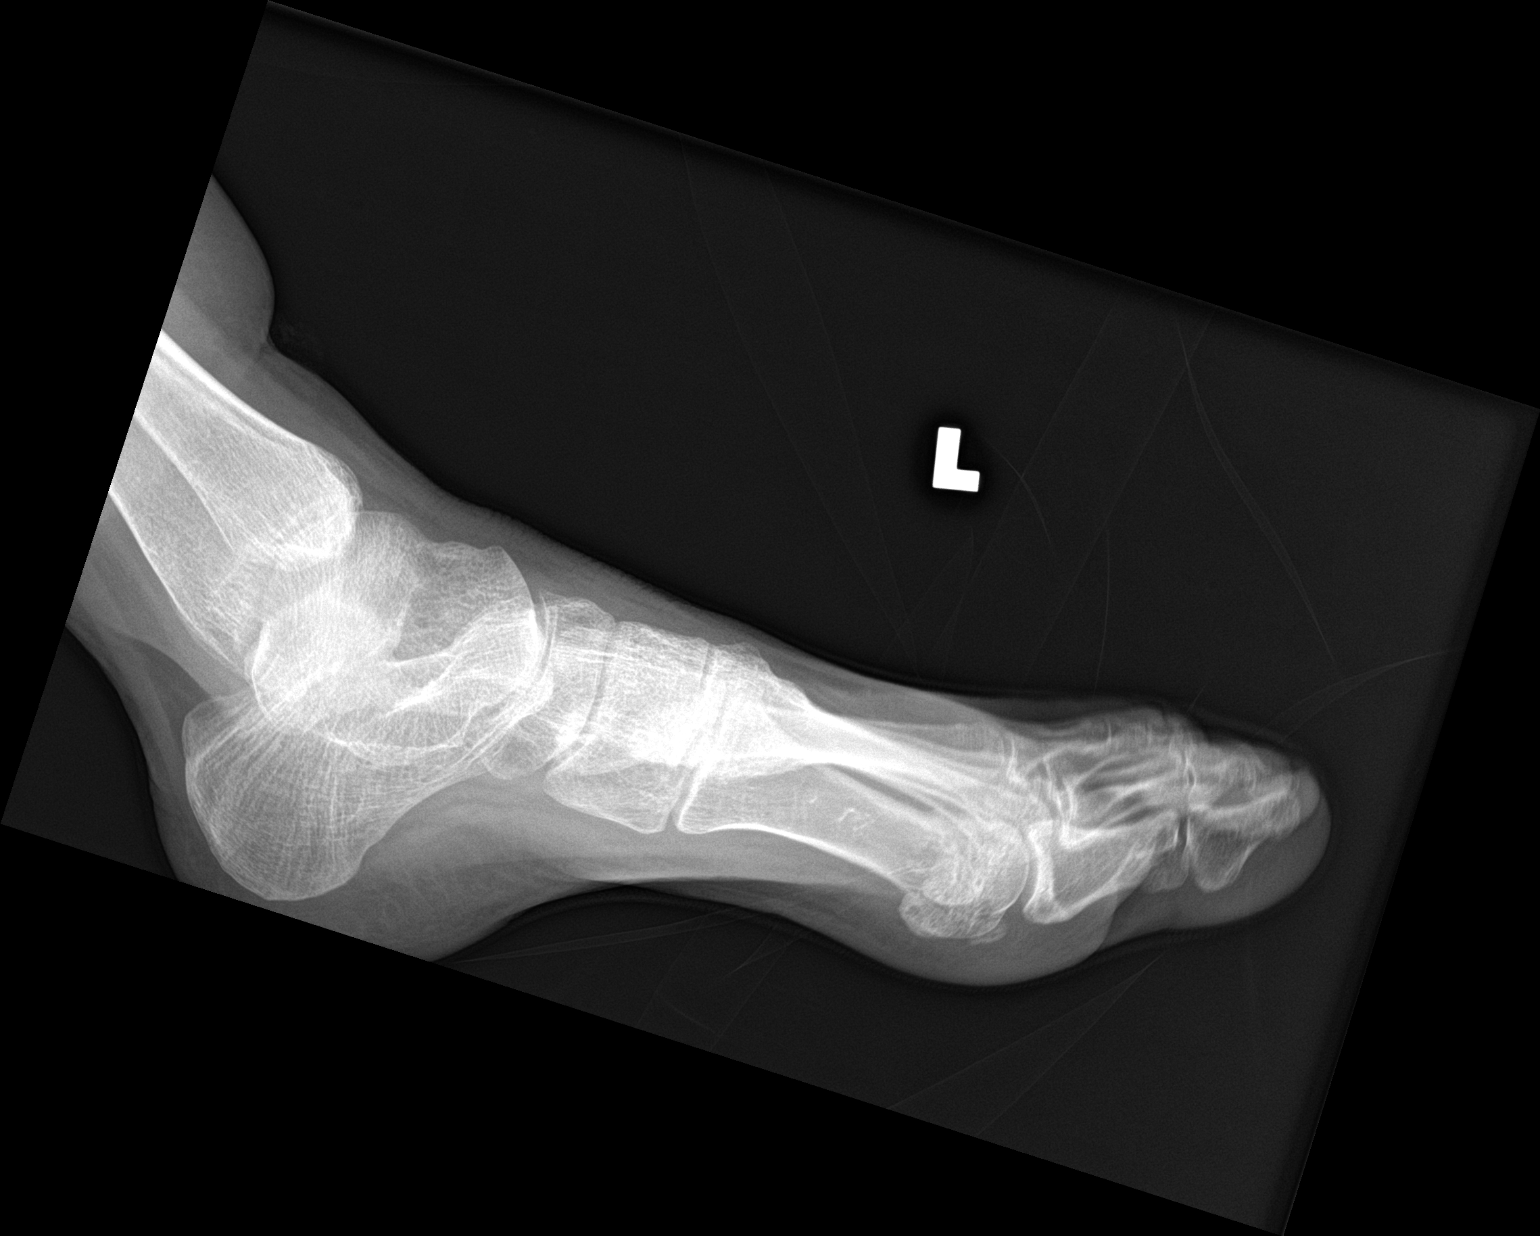

[3 of 3 positions shown; findings below may reference images not displayed]

FINDINGS: No acute fracture or dislocation. The ankle mortise is intact. The
bones are somewhat osteopenic for age. There is subcutaneous edema
of the leg.
IMPRESSION: 1. No acute fracture or dislocation.
2. Subcutaneous edema.

## 2022-10-31 ENCOUNTER — Other Ambulatory Visit: Payer: Self-pay | Admitting: Physician Assistant

## 2022-11-09 ENCOUNTER — Encounter: Payer: Self-pay | Admitting: Physician Assistant

## 2022-12-01 ENCOUNTER — Ambulatory Visit: Payer: MEDICAID | Admitting: Physician Assistant

## 2022-12-01 VITALS — BP 105/65 | HR 97 | Ht 64.0 in

## 2022-12-01 DIAGNOSIS — Z3042 Encounter for surveillance of injectable contraceptive: Secondary | ICD-10-CM

## 2022-12-01 MED ORDER — MEDROXYPROGESTERONE ACETATE 150 MG/ML IM SUSY
PREFILLED_SYRINGE | Freq: Once | INTRAMUSCULAR | Status: AC
Start: 2022-12-01 — End: 2022-12-01
  Administered 2022-12-01: 150 mg via INTRAMUSCULAR

## 2022-12-01 NOTE — Progress Notes (Signed)
   Established Patient Office Visit  Subjective   Patient ID: Elizabeth [name], female    DOB: 20-Oct-1995  Age: 27 y.o. MRN: [id number]  Chief Complaint  Patient presents with   encounter for surveillance of injectable contraceptive    Depo Provera nurse visit.     HPI  Depo Provera nurse visit.  Patient in office  with mother .   ROS    Objective:     BP 105/65   Pulse 97   Ht 5\' 4"  (1.626 m)   SpO2 98%   BMI 18.88 kg/m    Physical Exam   No results found for any visits on 12/01/22.    The ASCVD Risk score (Arnett DK, et al., 2019) failed to calculate for the following reasons:   The 2019 ASCVD risk score is only valid for ages 73 to 11    Assessment & Plan:  Admin Depoprovera injection 150mg  IM RUOQ. Patient tolerated injection well without complications.  Return between Sept 27- Oct 11 for next Depoprovera injection.  Problem List Items Addressed This Visit   None Visit Diagnoses     Encounter for surveillance of injectable contraceptive    -  Primary   Relevant Medications   medroxyPROGESTERone Acetate SUSY (Completed) (Start on 12/01/2022  9:15 AM)       Return for Return between Sept 27- Oct 11 for next Depoprovera injection nurse visit. Elizabeth Palau, LPN

## 2022-12-01 NOTE — Patient Instructions (Signed)
Return between Sept 27- Oct 11 for next Depoprovera injection nurse visit.

## 2022-12-01 NOTE — Progress Notes (Signed)
Agree with above plan. 

## 2023-01-02 ENCOUNTER — Encounter: Payer: Self-pay | Admitting: Physician Assistant

## 2023-01-02 ENCOUNTER — Ambulatory Visit (INDEPENDENT_AMBULATORY_CARE_PROVIDER_SITE_OTHER): Payer: MEDICAID | Admitting: Physician Assistant

## 2023-01-02 VITALS — BP 113/74 | HR 109 | Ht 64.0 in | Wt 114.3 lb

## 2023-01-02 DIAGNOSIS — Z0000 Encounter for general adult medical examination without abnormal findings: Secondary | ICD-10-CM

## 2023-01-02 DIAGNOSIS — E7800 Pure hypercholesterolemia, unspecified: Secondary | ICD-10-CM | POA: Diagnosis not present

## 2023-01-02 DIAGNOSIS — H409 Unspecified glaucoma: Secondary | ICD-10-CM

## 2023-01-02 DIAGNOSIS — E232 Diabetes insipidus: Secondary | ICD-10-CM | POA: Diagnosis not present

## 2023-01-02 DIAGNOSIS — E871 Hypo-osmolality and hyponatremia: Secondary | ICD-10-CM

## 2023-01-02 MED ORDER — TIMOLOL MALEATE 0.5 % OP SOLN
1.0000 [drp] | Freq: Two times a day (BID) | OPHTHALMIC | 3 refills | Status: AC
Start: 2023-01-02 — End: ?

## 2023-01-02 MED ORDER — DORZOLAMIDE HCL 2 % OP SOLN: OPHTHALMIC | 3 refills | Status: AC

## 2023-01-02 MED ORDER — ATORVASTATIN CALCIUM 10 MG PO TABS: 10.0000 mg | ORAL_TABLET | Freq: Every day | ORAL | 3 refills | Status: DC

## 2023-01-02 NOTE — Progress Notes (Signed)
Complete physical exam  Patient: Elizabeth [name]   DOB: 1995/08/17   26 y.o. Female  MRN: [id number]  Subjective:    Chief Complaint  Patient presents with   Annual Exam    labs    Elizabeth [name] is a 27 y.o. female who presents today for a complete physical exam. She reports consuming a general diet. The patient does not participate in regular exercise at present. She generally feels well. She reports sleeping well. She does not have additional problems to discuss today.    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   Intellectual disability 08/18/2014   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   Urinary incontinence in female 08/18/2014   Glaucoma 08/18/2014   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/18/2014   Past Medical History:  Diagnosis Date   Cleft palate and cleft lip    DI (diabetes insipidus) (HCC)    Past Surgical History:  Procedure Laterality Date   MENISCUS REPAIR Right 2012   Family History  Problem Relation Age of Onset   Alcoholism Maternal Grandfather    Cancer Maternal Grandmother    Heart attack Maternal Grandmother    Diabetes Maternal Grandmother        paternal grandfather   Allergies  Allergen Reactions   Antihistamines, Diphenhydramine-Type Other (See Comments)    Per mom all antihistamines seem to counteract with diabetes meds      Patient Care Team: Nolene Ebbs as PCP - General  (Family Medicine)   Outpatient Medications Prior to Visit  Medication Sig   cannabidiol (EPIDIOLEX) 100 MG/ML solution Take 1.3 mLs (130 mg total) by mouth 2 (two) times daily.   desmopressin (DDAVP) 0.2 MG tablet Take 0.2 mg by mouth daily. Take two tablets by mouth three times a day   folic acid (FOLVITE) 1 MG tablet TAKE 1 TABLET BY MOUTH EVERY DAY   Incontinence Supply Disposable (PREVAIL ADJ UNDERWEAR SM/MED) MISC Change underwear as needed. Fax to [phone]   LamoTRIgine (LAMICTAL PO) Take by mouth. Take 300 mg in am and take 200 mg in the pm   levETIRAcetam (KEPPRA) 500 MG tablet Take 2 tablets (1,000 mg total) by mouth 2 (two) times daily.   Melatonin 5 MG CAPS Take by mouth.   Rufinamide (BANZEL PO) Take by mouth. Take 1600 mg two times a day   topiramate (TOPAMAX) 100 MG tablet TAKE 1 TABLET(100 MG) BY MOUTH AT BEDTIME   traZODone (DESYREL) 50 MG tablet TAKE 1/2 TO 1 TABLET(25 TO 50 MG) BY MOUTH AT BEDTIME AS NEEDED FOR SLEEP   [DISCONTINUED] atorvastatin (LIPITOR) 10 MG tablet Take 1 tablet (10 mg total) by mouth daily.   [DISCONTINUED] dorzolamide (TRUSOPT) 2 % ophthalmic solution INSTILL 1 DROP IN BOTH EYES THREE TIMES DAILY   [DISCONTINUED] timolol (TIMOPTIC) 0.5 % ophthalmic solution Place 1 drop into both eyes 2 (two) times daily.   No facility-administered medications prior to visit.  Review of Systems  All other systems reviewed and are negative.         Objective:     BP 113/74   Pulse (!) 109   Ht 5\' 4"  (1.626 m)   Wt 114 lb 4.8 oz (51.8 kg)   SpO2 99%   BMI 19.62 kg/m  BP Readings from Last 3 Encounters:  01/02/23 113/74  12/01/22 105/65  09/15/22 93/80   Wt Readings from Last 3 Encounters:  01/02/23 114 lb 4.8 oz (51.8 kg)  09/15/22 110 lb (49.9 kg)  12/30/21 114 lb (51.7 kg)      Physical Exam  BP 113/74   Pulse (!) 109   Ht 5\' 4"  (1.626 m)   Wt 114 lb 4.8 oz (51.8 kg)   SpO2 99%   BMI 19.62 kg/m   General Appearance:    Alert,  cooperative, no distress, appears stated age  Head:    Normocephalic, without obvious abnormality, atraumatic  Eyes:    PERRL, conjunctiva/corneas clear, EOM's intact, fundi    benign, both eyes  Ears:    Normal TM's and external ear canals, both ears  Nose:   Nares normal, septum midline, mucosa normal, no drainage    or sinus tenderness  Throat:   Lips, mucosa, and tongue normal; teeth and gums normal  Neck:   Supple, symmetrical, trachea midline, no adenopathy;    thyroid:  no enlargement/tenderness/nodules; no carotid   bruit or JVD  Back:     Symmetric, no curvature, ROM normal, no CVA tenderness  Lungs:     Clear to auscultation bilaterally, respirations unlabored  Chest Wall:    No tenderness or deformity   Heart:    Regular rate and rhythm, S1 and S2 normal, no murmur, rub   or gallop     Abdomen:     Soft, non-tender, bowel sounds active all four quadrants,    no masses, no organomegaly        Extremities:   Extremities normal, atraumatic, no cyanosis or edema  Pulses:   2+ and symmetric all extremities  Skin:   Skin color, texture, turgor normal, no rashes or lesions  Lymph nodes:   Cervical, supraclavicular, and axillary nodes normal  Neurologic:   CNII-XII intact, normal strength, sensation and reflexes    throughout      Assessment & Plan:    Routine Health Maintenance and Physical Exam  Immunization History  Administered Date(s) Administered   DTaP 07/04/1994, 10/24/1994, 01/12/1995, 10/30/1995, 04/28/1999   HIB (PRP-OMP) 07/04/1994, 10/24/1994, 01/12/1995, 10/30/1995   HPV 9-valent 10/17/2011   Hepatitis A 08/22/2005, 03/05/2006   Hepatitis A, Ped/Adol-2 Dose 08/22/2005, 03/05/2006   Hepatitis B 10/24/1994, 11/28/1994, 05/18/1995   Hepatitis B, PED/ADOLESCENT 10/24/1994, 11/28/1994, 05/18/1995   Hpv-Unspecified 10/17/2011   Influenza,inj,Quad PF,6+ Mos 04/28/1999   MMR 07/31/1995, 06/07/1999   Meningococcal Conjugate 08/27/2007   Meningococcal polysaccharide  vaccine (MPSV4) 10/17/2011   Mumps 07/04/1994, 10/24/1994, 01/12/1995, 10/30/1995   PPD Test 10/30/2006, 11/17/2008   Td 08/22/2005, 10/17/2011   Tdap 12/30/2021   Varicella 04/28/1999    Health Maintenance  Topic Date Due   HPV VACCINES (2 - Risk 3-dose series) 03/25/2023 (Originally 11/14/2011)   Hepatitis C Screening  03/25/2023 (Originally 04/24/2012)   HIV Screening  03/25/2023 (Originally 04/24/2009)   COVID-19 Vaccine (1) 05/22/2023 (Originally 04/25/1999)   INFLUENZA VACCINE  08/20/2023 (Originally 12/21/2022)   PAP-Cervical Cytology Screening  09/02/2029 (Originally 04/25/2015)   PAP SMEAR-Modifier  09/02/2029 (Originally 04/25/2015)   DTaP/Tdap/Td (9 -  Td or Tdap) 12/31/2031    Discussed health benefits of physical activity, and encouraged her to engage in regular exercise appropriate for her age and condition.  Marland KitchenMardella Layman was seen today for annual exam.  Diagnoses and all orders for this visit:  Routine physical examination -     TSH -     CBC with Differential/Platelet -     Cancel: Lipid panel -     COMPLETE METABOLIC PANEL WITH GFR -     Lipid panel -     CMP14+EGFR  Glaucoma of both eyes, unspecified glaucoma type -     timolol (TIMOPTIC) 0.5 % ophthalmic solution; Place 1 drop into both eyes 2 (two) times daily. -     dorzolamide (TRUSOPT) 2 % ophthalmic solution; INSTILL 1 DROP IN BOTH EYES THREE TIMES DAILY  Diabetes insipidus (HCC) -     TSH -     CBC with Differential/Platelet -     COMPLETE METABOLIC PANEL WITH GFR  Elevated LDL cholesterol level -     Cancel: Lipid panel -     atorvastatin (LIPITOR) 10 MG tablet; Take 1 tablet (10 mg total) by mouth daily. -     Lipid panel  Hyponatremia -     CMP14+EGFR   .[name] Discussed 150 minutes of exercise a week.  Encouraged vitamin D 1000 units and Calcium 1300mg  or 4 servings of dairy a day.  No concerns today Not sexually active and no need for pap Sent refills of medications Vitals look great Fasting  labs ordered Declined all vaccines    Return in about 1 year (around 01/02/2024).     Tandy Gaw, PA-C

## 2023-01-02 NOTE — Patient Instructions (Signed)

## 2023-01-03 NOTE — Progress Notes (Signed)
Sodium 146 Thyroid looks great.

## 2023-01-25 ENCOUNTER — Other Ambulatory Visit: Payer: Self-pay | Admitting: Physician Assistant

## 2023-02-12 ENCOUNTER — Telehealth: Payer: Self-pay | Admitting: Physician Assistant

## 2023-02-12 LAB — BASIC METABOLIC PANEL
BUN: 14 (ref 4–21)
CO2: 20 (ref 13–22)
Chloride: 112 — AB (ref 99–108)
Creatinine: 1.2 — AB (ref 0.5–1.1)
Glucose: 73
Potassium: 3.7 meq/L (ref 3.5–5.1)
Sodium: 148 — AB (ref 137–147)

## 2023-02-12 LAB — COMPREHENSIVE METABOLIC PANEL
Calcium: 9.5 (ref 8.7–10.7)
eGFR: 64

## 2023-02-12 NOTE — Telephone Encounter (Signed)
Patient's mother called. Manufacturer no longer carries the Dorzolamide per pharmacy. Can you prescribe another med in its place?

## 2023-02-13 ENCOUNTER — Telehealth: Payer: Self-pay

## 2023-02-13 DIAGNOSIS — H409 Unspecified glaucoma: Secondary | ICD-10-CM

## 2023-02-13 LAB — OTHER LAB RESULT: Other: 12

## 2023-02-13 NOTE — Telephone Encounter (Signed)
Is she still seeing a eye doctor regularly? I would rather them make the switch. From my knowledge latanoprost is similar in efficacy but would be nice to have a glaucoma specialist weigh in.

## 2023-02-13 NOTE — Telephone Encounter (Signed)
Lets see how soon you can see eye doctor downstairs to suggest an eye drop to switch to.

## 2023-02-14 NOTE — Telephone Encounter (Signed)
Pt mother states she doe not have vision coverage

## 2023-02-15 ENCOUNTER — Telehealth: Payer: Self-pay

## 2023-02-15 DIAGNOSIS — H409 Unspecified glaucoma: Secondary | ICD-10-CM

## 2023-02-15 NOTE — Telephone Encounter (Signed)
Pt's mother request for  referral to see eye doctor

## 2023-02-15 NOTE — Telephone Encounter (Signed)
Pt has vision coverage through current insurance. Can you please enter a referral in the system for pt to be seen?  Pt wants to referral sent to [facility]  [address] [place], [place] [address]  Local: [phone] Fax: [phone]

## 2023-02-16 NOTE — Addendum Note (Signed)
Addended by: Ernest Mallick A on: 02/16/2023 02:24 PM   Modules accepted: Orders

## 2023-02-19 NOTE — Telephone Encounter (Signed)
Referral, clinical notes, imaging reports, demographics and copies of insurance cards have been faxed to [facility] at [phone]. Office will contact patient's mom to schedule referral appointment.    [facility] [address] [place], [place] [address]   Local Ph: [phone] Fax: [phone]

## 2023-02-22 ENCOUNTER — Ambulatory Visit (INDEPENDENT_AMBULATORY_CARE_PROVIDER_SITE_OTHER): Payer: MEDICAID

## 2023-02-22 DIAGNOSIS — Z3042 Encounter for surveillance of injectable contraceptive: Secondary | ICD-10-CM

## 2023-02-22 MED ORDER — MEDROXYPROGESTERONE ACETATE 150 MG/ML IM SUSY
150.0000 mg | PREFILLED_SYRINGE | Freq: Once | INTRAMUSCULAR | Status: AC
Start: 2023-02-22 — End: 2023-02-22
  Administered 2023-02-22: 150 mg via INTRAMUSCULAR

## 2023-02-22 NOTE — Progress Notes (Signed)
   Established Patient Office Visit  Subjective   Patient ID: Elizabeth [name], female    DOB: 08-06-1995  Age: 27 y.o. MRN: [id number]  Chief Complaint  Patient presents with   Contraception    HPI  Elizabeth [name] is here for a Depo Provera injection. Denies chest pain, shortness of breath, headaches, mood changes or problems with medication. It has been < 14 weeks since her last Depo Provera injection.    ROS    Objective:     There were no vitals taken for this visit.   Physical Exam   No results found for any visits on 02/22/23.    The ASCVD Risk score (Arnett DK, et al., 2019) failed to calculate for the following reasons:   The 2019 ASCVD risk score is only valid for ages 11 to 24    Assessment & Plan:  Depo Provera injection - Patient tolerated injection well without complications. Patient advised to schedule next injection between December 19th through January 2nd.    Problem List Items Addressed This Visit   None Visit Diagnoses     Encounter for surveillance of injectable contraceptive    -  Primary   Relevant Medications   medroxyPROGESTERone Acetate SUSY 150 mg (Completed) (Start on 02/22/2023  8:45 AM)       Return in about 12 weeks (around 05/17/2023) for Depo Provera injection. Earna Coder, Janalyn Harder, CMA

## 2023-03-02 ENCOUNTER — Encounter: Payer: Self-pay | Admitting: Physician Assistant

## 2023-04-17 ENCOUNTER — Encounter: Payer: Self-pay | Admitting: Physician Assistant

## 2023-04-17 ENCOUNTER — Telehealth (INDEPENDENT_AMBULATORY_CARE_PROVIDER_SITE_OTHER): Payer: MEDICAID | Admitting: Physician Assistant

## 2023-04-17 VITALS — Temp 99.0°F

## 2023-04-17 DIAGNOSIS — R112 Nausea with vomiting, unspecified: Secondary | ICD-10-CM | POA: Insufficient documentation

## 2023-04-17 DIAGNOSIS — R509 Fever, unspecified: Secondary | ICD-10-CM | POA: Insufficient documentation

## 2023-04-17 MED ORDER — ONDANSETRON 8 MG PO TBDP
8.0000 mg | ORAL_TABLET | Freq: Three times a day (TID) | ORAL | 0 refills | Status: AC | PRN
Start: 2023-04-17 — End: ?

## 2023-04-17 MED ORDER — PROMETHAZINE HCL 25 MG RE SUPP: 25.0000 mg | Freq: Four times a day (QID) | RECTAL | 0 refills | Status: AC | PRN

## 2023-04-17 NOTE — Progress Notes (Signed)
..Virtual Visit via Video Note  I connected with Elizabeth [name] on 04/17/23 at 10:50 AM EST by a video enabled telemedicine application and verified that I am speaking with the correct person using two identifiers.  Location: Patient: home Provider: clinic  .Marland KitchenParticipating in visit:  Patient: Linday's guardian Moody Bruins)  Provider: Tandy Gaw PA-C Provider in training: Carlyle Dolly PA-S   I discussed the limitations of evaluation and management by telemedicine and the availability of in person appointments. The patient expressed understanding and agreed to proceed.  History of Present Illness: Pt is a 27 yo nonverbal female whose history is given by guardian. She woke up at 1am and started vomiting. She has vomited about 5-6 times until 8am but has not vomited since. She has not had any diarrhea or constipation. She is running a 99 degree temperature. She has not really been able to drink more than a few sips but was able to eat a half a piece of dry toast at 9:30 and keep it down. She is not having any problems breathing. No other sick contacts in home. She has only had one wet diaper today and guardian is worried about dehydration.   .. 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/18/2014   Irregular periods 07/21/2015   Menorrhagia with irregular cycle 02/22/2016   Mass of axilla, right 04/24/2017   Irritability 02/01/2018   Closed fracture of second metatarsal bone base of left foot 09/03/2019   Blindness 11/16/2014   Elevated LDL cholesterol level 02/06/2021   Skin tag 12/15/2021   Resolved Ambulatory Problems    Diagnosis Date Noted   No Resolved Ambulatory Problems   Past Medical History:  Diagnosis Date   Cleft palate and cleft lip    DI (diabetes  insipidus) (HCC)       Observations/Objective: No acute distress Normal breathing laying on couch  .[name] Today's Vitals   04/17/23 1057  Temp: 99 F (37.2 C)   There is no height or weight on file to calculate BMI.    Assessment and Plan: Marland KitchenMarland KitchenLeafie was seen today for emesis and fever.  Diagnoses and all orders for this visit:  Nausea and vomiting, unspecified vomiting type -     promethazine (PHENERGAN) 25 MG suppository; Place 1 suppository (25 mg total) rectally every 6 (six) hours as needed for nausea or vomiting. -     ondansetron (ZOFRAN-ODT) 8 MG disintegrating tablet; Take 1 tablet (8 mg total) by mouth every 8 (eight) hours as needed.  Fever, unspecified fever cause -     promethazine (PHENERGAN) 25 MG suppository; Place 1 suppository (25 mg total) rectally every 6 (six) hours as needed for nausea or vomiting. -     ondansetron (ZOFRAN-ODT) 8 MG disintegrating tablet; Take 1 tablet (8 mg total) by mouth every 8 (eight) hours as needed.   Likely viral etiology Recommended getting covid and flu test OTC and making sure they are negative BRAT diet and consider frozen ways to hydrate like popcicle or ice Phenergan suppositories and ODT zofran as needed so get can try to keep water and electrolytes down Follow up as needed or not able to keep fluids down with anti nausea go to ED for fluid consideration about 48 hours.     Follow Up Instructions:    I  discussed the assessment and treatment plan with the patient. The patient was provided an opportunity to ask questions and all were answered. The patient agreed with the plan and demonstrated an understanding of the instructions.   The patient was advised to call back or seek an in-person evaluation if the symptoms worsen or if the condition fails to improve as anticipated.  Tandy Gaw, PA-C

## 2023-04-28 ENCOUNTER — Other Ambulatory Visit: Payer: Self-pay | Admitting: Physician Assistant

## 2023-05-07 ENCOUNTER — Other Ambulatory Visit: Payer: Self-pay | Admitting: Physician Assistant

## 2023-05-10 ENCOUNTER — Ambulatory Visit: Payer: MEDICAID

## 2023-05-11 ENCOUNTER — Ambulatory Visit (INDEPENDENT_AMBULATORY_CARE_PROVIDER_SITE_OTHER): Payer: MEDICAID | Admitting: Family Medicine

## 2023-05-11 VITALS — Ht 64.0 in | Wt 114.3 lb

## 2023-05-11 DIAGNOSIS — Z3042 Encounter for surveillance of injectable contraceptive: Secondary | ICD-10-CM

## 2023-05-11 MED ORDER — MEDROXYPROGESTERONE ACETATE 150 MG/ML IM SUSY
PREFILLED_SYRINGE | Freq: Once | INTRAMUSCULAR | Status: AC
Start: 2023-05-11 — End: 2023-05-11
  Administered 2023-05-11: 150 mg via INTRAMUSCULAR

## 2023-05-11 NOTE — Progress Notes (Unsigned)
Pt presents to day for injectable contraception. No allergy to eggs or latex. Pt was in an agitated state. Unable to take vitals.   Location: [facility]  Pt tolerated well.

## 2023-05-14 NOTE — Progress Notes (Signed)
Agree with documentation as above.   Teonna Coonan, MD  

## 2023-06-08 ENCOUNTER — Ambulatory Visit (INDEPENDENT_AMBULATORY_CARE_PROVIDER_SITE_OTHER): Payer: MEDICAID | Admitting: Physician Assistant

## 2023-06-08 VITALS — BP 117/80 | HR 121 | Ht 64.0 in | Wt 116.0 lb

## 2023-06-08 DIAGNOSIS — S0101XD Laceration without foreign body of scalp, subsequent encounter: Secondary | ICD-10-CM

## 2023-06-08 DIAGNOSIS — Z4802 Encounter for removal of sutures: Secondary | ICD-10-CM | POA: Diagnosis not present

## 2023-06-08 NOTE — Progress Notes (Signed)
   Established Patient Office Visit  Subjective   Patient ID: Elizabeth [name], female    DOB: Sep 07, 1995  Age: 28 y.o. MRN: [id number]  Chief Complaint  Patient presents with   Suture / Staple Removal    HPI Pt is a non verbal female with hx of seizures who presents to the clinic with her guardian to have staples removed from scalp after syncope episode and head trauma. Pt is doing well.   .. 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    Objective:     BP 117/80   Pulse (!) 121   Ht 5\' 4"  (1.626 m)   Wt 116 lb (52.6 kg)   SpO2 99%   BMI 19.91 kg/m  BP Readings from Last 3 Encounters:  06/08/23 117/80  01/02/23 113/74  12/01/22 105/65   Wt Readings from Last 3 Encounters:  06/08/23 116 lb (52.6 kg)  05/11/23 114 lb 4.8 oz (51.8 kg)  01/02/23 114 lb 4.8 oz (51.8 kg)      Physical Exam 6 staples removed from scalp at crown, well healed laceration 3cm by 10mm     Assessment & Plan:  Marland KitchenMarland KitchenKamella was seen today for suture / staple removal.  Diagnoses and all orders for this visit:  Removal of staples  Laceration of scalp without foreign body, subsequent encounter    Pt  is doing great. Removed staples.  Discussed how to keep area clean Provided bactroban to use bid for the next 3-5 days.   Tandy Gaw, PA-C

## 2023-07-28 ENCOUNTER — Other Ambulatory Visit: Payer: Self-pay | Admitting: Physician Assistant

## 2023-08-01 ENCOUNTER — Ambulatory Visit: Payer: MEDICAID

## 2023-08-01 ENCOUNTER — Ambulatory Visit (INDEPENDENT_AMBULATORY_CARE_PROVIDER_SITE_OTHER): Payer: MEDICAID | Admitting: Physician Assistant

## 2023-08-01 VITALS — Temp 98.2°F

## 2023-08-01 DIAGNOSIS — S00431A Contusion of right ear, initial encounter: Secondary | ICD-10-CM | POA: Diagnosis not present

## 2023-08-01 MED ORDER — DOXYCYCLINE HYCLATE 100 MG PO TABS
100.0000 mg | ORAL_TABLET | Freq: Two times a day (BID) | ORAL | 0 refills | Status: DC
Start: 2023-08-01 — End: 2024-01-04

## 2023-08-01 NOTE — Patient Instructions (Signed)
 Cauliflower Ear Cauliflower ear refers to ear damage that causes the outer part of the ear (pinna) to look shapeless and lumpy, similar to a cauliflower. This usually results from repeated hard, direct hits or injuries to the outer ear. The force of these injuries to the ear can cause a collection of fluid or blood to form (hematoma). It is important to get medical attention for any outer ear injury. Infection is common in outer ear injuries, and this can increase the risk for cauliflower ear. What are the causes? This condition is commonly caused by: Repeated blunt trauma to the ear, such as direct hits during boxing or wrestling matches. Injuries from other contact sports, such as rugby or martial arts. Ear piercings, especially in the upper ear. What are the signs or symptoms? Symptoms of this condition include: Pain. Swelling. Bruising. Deformed ear shape. The ear may look lumpy and larger than normal. In severe cases, symptoms may also include: Ringing in the ear (tinnitus). Hearing loss. Headache. Severe bleeding. Blurred vision. Facial swelling. How is this diagnosed? This condition may be diagnosed based on: A physical exam. A history of trauma to your ear. How is this treated? Getting treatment right away for an outer ear injury can reduce the risk that your ear will be deformed. Treatment may include: Aspiration. For this procedure, a needle is used to drain the hematoma. This may prevent more damage to the ear. Constant compression. This means applying constant pressure to the ear to help prevent fluid and blood from building up again. Compression may be applied to your ear with: Bandages (dressings). Two formed molds that fit together around the ear (bolster). Stitching (suturing) a drainage tube onto your ear to allow fluid and blood to drain over time. This may be done in severe cases. Antibiotic medicines. Pain medicines or cold therapy to help relieve pain and  swelling. In some cases, corrective surgery (otoplasty) may be needed to remove scar tissue and reshape the ear. Follow these instructions at home:  Take over-the-counter and prescription medicines only as told by your health care provider. If you were prescribed an antibiotic, take it as told by your health care provider. Do not stop taking the antibiotic even if you start to feel better. If wearing ear compression, follow instructions about how long to keep this in place. Usually, this is worn for about a week. If directed, put ice on your ear. To do this: Put ice in a plastic bag. Place a towel between your skin and the bag. Leave the ice on for 20 minutes, 2-3 times a day. Return to your normal activities as told by your health care provider. Ask your health care provider what activities are safe for you. Wear protective headgear during contact sports such as wrestling. Monitor your ear for any signs of fluid or blood buildup, such as more pain or swelling. Keep all follow-up visits as told by your health care provider. This is important. Contact a health care provider if: You have redness, warmth, and swelling that does not improve in a few days. You have a fever. Get help right away if: You have redness that spreads to other areas of your head or neck. Summary Ear damage that causes the outer part of the ear (pinna) to look shapeless and lumpy is referred to as cauliflower ear. The most common cause of cauliflower ear is a direct hit or repeated hits to the ear, such as from boxing or wrestling. Direct or repeated hits to  the ear can cause a collection of fluid or blood (hematoma) to form. This can result in a deformed external ear. Contact your health care provider if you have an injury to your outer ear. The earlier you get treatment, the less likely you will develop cauliflower ear. This information is not intended to replace advice given to you by your health care provider. Make  sure you discuss any questions you have with your health care provider. Document Revised: 03/26/2023 Document Reviewed: 03/26/2023 Elsevier Patient Education  2024 [employer].

## 2023-08-02 ENCOUNTER — Encounter: Payer: Self-pay | Admitting: Physician Assistant

## 2023-08-02 NOTE — Progress Notes (Signed)
   Acute Office Visit  Subjective:     Patient ID: Elizabeth [name], female    DOB: 20-Oct-1995, 28 y.o.   MRN: [id number]  No chief complaint on file.   HPI Patient is in today for right ear bruise. Pt is non verbal and her mother brings her in and acts as historian. Mother noticed her right ear appeared bruise last night before bed. Pt does have a history of fall but mother has not witnessed any falls recently. She does go to a daycare during the day. Pt has not ran a fever, chills or having any URI symptoms. Pt acts like ear is sensitive to touch but not overtly painful.    ROS  See HPI.     Objective:    There were no vitals taken for this visit. BP Readings from Last 3 Encounters:  06/08/23 117/80  01/02/23 113/74  12/01/22 105/65   Wt Readings from Last 3 Encounters:  06/08/23 116 lb (52.6 kg)  05/11/23 114 lb 4.8 oz (51.8 kg)  01/02/23 114 lb 4.8 oz (51.8 kg)      Physical Exam  Auricle of right ear is bruised with hematoma formation in the central ear but does not extended into canal. Central ear does look more swollen and red with concern for a small pustule.  Pt flinches to pull up on ear. No drainage, TM intact with no signs of infection.     Assessment & Plan:  Marland KitchenMarland KitchenDiagnoses and all orders for this visit:  Hematoma of right auricular region -     doxycycline (VIBRA-TABS) 100 MG tablet; Take 1 tablet (100 mg total) by mouth 2 (two) times daily.   HO given on hematoma. Certainly suspect some time of trauma to cause this. Concern trauma at daycare that they do not know about.  Warm compresses to start and tylenol if pt seems like it hurts her Start doxycycline for signs of infection Follow up if not improving or worsening or new symptoms.    Tandy Gaw, PA-C

## 2023-08-14 ENCOUNTER — Ambulatory Visit (INDEPENDENT_AMBULATORY_CARE_PROVIDER_SITE_OTHER): Payer: MEDICAID

## 2023-08-14 VITALS — BP 101/62

## 2023-08-14 DIAGNOSIS — Z3042 Encounter for surveillance of injectable contraceptive: Secondary | ICD-10-CM | POA: Diagnosis not present

## 2023-08-14 MED ORDER — MEDROXYPROGESTERONE ACETATE 150 MG/ML IM SUSP
150.0000 mg | Freq: Once | INTRAMUSCULAR | Status: AC
  Administered 2023-08-14: 150 mg via INTRAMUSCULAR

## 2023-08-14 NOTE — Patient Instructions (Signed)
 Return between June 10th and June 24th  for next Depoprovera injection as nurse visit.

## 2023-08-14 NOTE — Progress Notes (Signed)
   Established Patient Office Visit  Subjective   Patient ID: Elizabeth [name], female    DOB: [birth year]  Age: 28 y.o. MRN: [id number]  Chief Complaint  Patient presents with   Depo-provera injection    Last given 05/11/2023    HPI  Depo-Provera injection - nurse visit.   ROS    Objective:     BP 101/62    Physical Exam   No results found for any visits on 08/14/23.    The ASCVD Risk score (Arnett DK, et al., 2019) failed to calculate for the following reasons:   The 2019 ASCVD risk score is only valid for ages 59 to 55    Assessment & Plan:  Depo-porvera injection nurse visit. Admin 150mg   IM LUOQ. Patient tolerated injection well without complications. Return between June 10th and June 25th for next Depo-Provera injection.   Problem List Items Addressed This Visit   None   No follow-ups on file.    Elizabeth Palau, LPN

## 2023-10-30 ENCOUNTER — Other Ambulatory Visit: Payer: Self-pay | Admitting: Physician Assistant

## 2023-10-30 MED ORDER — TRAZODONE HCL 50 MG PO TABS: ORAL_TABLET | ORAL | 0 refills | Status: DC

## 2023-10-30 NOTE — Telephone Encounter (Signed)
 Copied from CRM [id number]. Topic: Clinical - Medication Refill >> Oct 30, 2023  2:04 PM Shamecia H wrote: Medication:  traZODone  (DESYREL ) 50 MG tablet   Has the patient contacted their pharmacy? Yes (Agent: If no, request that the patient contact the pharmacy for the refill. If patient does not wish to contact the pharmacy document the reason why and proceed with request.) (Agent: If yes, when and what did the pharmacy advise?)  This is the patient's preferred pharmacy:  [facility]  [address] [place], [place]  [address] [phone]  Is this the correct pharmacy for this prescription? Yes If no, delete pharmacy and type the correct one.   Has the prescription been filled recently? Yes  Is the patient out of the medication? Yes  Has the patient been seen for an appointment in the last year OR does the patient have an upcoming appointment? Yes  Can we respond through MyChart? Yes  Agent: Please be advised that Rx refills may take up to 3 business days. We ask that you follow-up with your pharmacy.

## 2023-11-01 ENCOUNTER — Ambulatory Visit: Payer: MEDICAID

## 2023-11-01 VITALS — BP 110/81 | HR 71 | Temp 98.6°F

## 2023-11-01 DIAGNOSIS — Z3042 Encounter for surveillance of injectable contraceptive: Secondary | ICD-10-CM | POA: Diagnosis not present

## 2023-11-01 MED ORDER — MEDROXYPROGESTERONE ACETATE 150 MG/ML IM SUSP
150.0000 mg | INTRAMUSCULAR | Status: AC
  Administered 2023-11-01 – 2024-06-27 (×3): 150 mg via INTRAMUSCULAR

## 2023-11-01 NOTE — Progress Notes (Signed)
 Depo-porvera injection nurse visit. Admin 150mg  IM RUOQ. Patient tolerated injection well without complications. Return between July 10th and July 25th for next Depo-Provera  injection.

## 2024-01-04 ENCOUNTER — Encounter: Payer: Self-pay | Admitting: Physician Assistant

## 2024-01-04 ENCOUNTER — Ambulatory Visit (INDEPENDENT_AMBULATORY_CARE_PROVIDER_SITE_OTHER): Payer: MEDICAID | Admitting: Physician Assistant

## 2024-01-04 VITALS — BP 140/97 | HR 160 | Ht 65.0 in | Wt 114.0 lb

## 2024-01-04 DIAGNOSIS — E871 Hypo-osmolality and hyponatremia: Secondary | ICD-10-CM

## 2024-01-04 DIAGNOSIS — H409 Unspecified glaucoma: Secondary | ICD-10-CM

## 2024-01-04 DIAGNOSIS — E7800 Pure hypercholesterolemia, unspecified: Secondary | ICD-10-CM | POA: Diagnosis not present

## 2024-01-04 DIAGNOSIS — E232 Diabetes insipidus: Secondary | ICD-10-CM | POA: Diagnosis not present

## 2024-01-04 DIAGNOSIS — G40813 Lennox-Gastaut syndrome, intractable, with status epilepticus: Secondary | ICD-10-CM

## 2024-01-04 DIAGNOSIS — Z0000 Encounter for general adult medical examination without abnormal findings: Secondary | ICD-10-CM | POA: Diagnosis not present

## 2024-01-04 DIAGNOSIS — Z79899 Other long term (current) drug therapy: Secondary | ICD-10-CM

## 2024-01-04 MED ORDER — ATORVASTATIN CALCIUM 10 MG PO TABS
10.0000 mg | ORAL_TABLET | Freq: Every day | ORAL | 3 refills | Status: AC
Start: 2024-01-04 — End: ?

## 2024-01-04 MED ORDER — FOLIC ACID 1 MG PO TABS
1.0000 mg | ORAL_TABLET | Freq: Every day | ORAL | 3 refills | Status: AC
Start: 2024-01-04 — End: ?

## 2024-01-04 NOTE — Progress Notes (Unsigned)
 Complete physical exam  Patient: Elizabeth [name]   DOB: 1996/03/21   28 y.o. Female  MRN: [id number]  Subjective:    Chief Complaint  Patient presents with   Annual Exam    Shawneequa Baldridge is a 28 y.o. female who presents today for a complete physical exam. She is accompanied by her caregiver and aunt. 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   She reports consuming a general diet. She does not exercise but her caregiver does arrange for her to have outings of engagement during the week. She generally feels well. She reports sleeping fairly well. She has good and bad nights but mostly good.  She does not have additional problems to discuss today.    Most recent fall risk assessment:    01/07/2024   12:53 PM  Fall Risk   Falls in the past year? 1  Number falls in past yr: 1  Injury with Fall? 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   Hematoma of right auricular region 08/01/2023   Nausea and vomiting 04/17/2023   Fever 04/17/2023   Skin tag 12/15/2021   Elevated LDL cholesterol level 02/06/2021   Closed fracture of second metatarsal bone base of left foot 09/03/2019   Irritability 02/01/2018   Mass of axilla, right 04/24/2017   Menorrhagia with irregular cycle 02/22/2016   Irregular periods 07/21/2015   Blindness 11/16/2014   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   Intellectual disability 08/18/2014   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   Urinary incontinence in female 08/18/2014   Glaucoma 08/18/2014   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/18/2014   Past  Medical History:  Diagnosis Date   Allergy 12/01996   antihistamne   Cleft palate and cleft lip    DI (diabetes insipidus) (HCC)    Glaucoma 05/22/2009   Seizures (HCC) 10/21/1994   Past Surgical History:  Procedure Laterality Date   EYE SURGERY  05/22/2008   MENISCUS REPAIR Right 05/22/2010   Allergies  Allergen Reactions   Antihistamines, Diphenhydramine-Type Other (See Comments)    Per mom all antihistamines seem to counteract with diabetes meds      Patient Care Team: Jarmar Rousseau L, PA-C as PCP - General (Family Medicine)   Outpatient Medications Prior to Visit  Medication Sig   cannabidiol (EPIDIOLEX) 100 MG/ML solution Take 1.3 mLs (130 mg total) by mouth 2 (two) times daily.   desmopressin (DDAVP) 0.2 MG tablet Take 0.2 mg by mouth daily. Take two tablets by mouth three times a day   dorzolamide (TRUSOPT) 2 % ophthalmic solution INSTILL 1 DROP IN BOTH EYES THREE TIMES DAILY   Incontinence Supply Disposable (PREVAIL ADJ UNDERWEAR SM/MED) MISC Change underwear as needed. Fax to [phone]   LamoTRIgine (LAMICTAL PO) Take by mouth. Take 300 mg in am and take 200 mg in the pm   levETIRAcetam (KEPPRA) 500 MG tablet Take 2 tablets (1,000 mg total) by mouth 2 (two) times daily.   Melatonin 5 MG CAPS Take by mouth.   ondansetron (ZOFRAN-ODT) 8 MG disintegrating tablet Take 1 tablet (8 mg total) by mouth every  8 (eight) hours as needed.   promethazine (PHENERGAN) 25 MG suppository Place 1 suppository (25 mg total) rectally every 6 (six) hours as needed for nausea or vomiting.   Rufinamide (BANZEL PO) Take by mouth. Take 1600 mg two times a day   timolol (TIMOPTIC) 0.5 % ophthalmic solution Place 1 drop into both eyes 2 (two) times daily.   topiramate (TOPAMAX) 100 MG tablet TAKE 1 TABLET(100 MG) BY MOUTH AT BEDTIME   traZODone (DESYREL) 50 MG tablet TAKE 1/2 TO 1 TABLET(25 TO 50 MG) BY MOUTH AT BEDTIME AS NEEDED FOR SLEEP   [DISCONTINUED] atorvastatin (LIPITOR) 10 MG  tablet Take 1 tablet (10 mg total) by mouth daily.   [DISCONTINUED] doxycycline (VIBRA-TABS) 100 MG tablet Take 1 tablet (100 mg total) by mouth 2 (two) times daily.   [DISCONTINUED] folic acid (FOLVITE) 1 MG tablet TAKE 1 TABLET BY MOUTH EVERY DAY   Facility-Administered Medications Prior to Visit  Medication Dose Route Frequency Provider   medroxyPROGESTERone (DEPO-PROVERA) injection 150 mg  150 mg Intramuscular Q90 days Haylyn Halberg L, PA-C    Review of Systems  All other systems reviewed and are negative.         Objective:     BP (!) 140/97   Pulse (!) 160   Ht 5' 5 (1.651 m)   Wt 114 lb (51.7 kg)   SpO2 99%   BMI 18.97 kg/m  BP Readings from Last 3 Encounters:  01/04/24 (!) 140/97  11/01/23 110/81  08/14/23 101/62   Wt Readings from Last 3 Encounters:  01/04/24 114 lb (51.7 kg)  06/08/23 116 lb (52.6 kg)  05/11/23 114 lb 4.8 oz (51.8 kg)      Physical Exam Constitutional:      Comments: Non verbal  HENT:     Head: Normocephalic.     Nose: Nose normal.     Mouth/Throat:     Mouth: Mucous membranes are moist.  Eyes:     Conjunctiva/sclera: Conjunctivae normal.  Cardiovascular:     Rate and Rhythm: Normal rate and regular rhythm.  Pulmonary:     Effort: Pulmonary effort is normal.     Breath sounds: Normal breath sounds.  Abdominal:     General: Bowel sounds are normal. There is no distension.     Palpations: Abdomen is soft. There is no mass.     Tenderness: There is no abdominal tenderness. There is no right CVA tenderness, left CVA tenderness, guarding or rebound.     Hernia: No hernia is present.  Musculoskeletal:     Cervical back: Neck supple. No tenderness.     Right lower leg: No edema.     Left lower leg: No edema.  Lymphadenopathy:     Cervical: No cervical adenopathy.  Neurological:     General: No focal deficit present.     Mental Status: She is alert and oriented to person, place, and time.  Psychiatric:        Mood and Affect:  Mood normal.         Assessment & Plan:    Routine Health Maintenance and Physical Exam  Immunization History  Administered Date(s) Administered   DTaP 07/04/1994, 10/24/1994, 01/12/1995, 10/30/1995, 04/28/1999   HIB (PRP-OMP) 07/04/1994, 10/24/1994, 01/12/1995, 10/30/1995   HPV 9-valent 10/17/2011   Hepatitis A 08/22/2005, 03/05/2006   Hepatitis A, Ped/Adol-2 Dose 08/22/2005, 03/05/2006   Hepatitis B 10/24/1994, 11/28/1994, 05/18/1995   Hepatitis B, PED/ADOLESCENT 10/24/1994, 11/28/1994, 05/18/1995   Hpv-Unspecified 10/17/2011   Influenza,inj,Quad PF,6+  Mos 04/28/1999   MMR 07/31/1995, 06/07/1999   Meningococcal Conjugate 08/27/2007   Meningococcal polysaccharide vaccine (MPSV4) 10/17/2011   Mumps 07/04/1994, 10/24/1994, 01/12/1995, 10/30/1995   PPD Test 10/30/2006, 11/17/2008   Td 08/22/2005, 10/17/2011   Tdap 12/30/2021   Varicella 04/28/1999    Health Maintenance  Topic Date Due   COVID-19 Vaccine (1) 01/23/2024 (Originally 04/25/1999)   HPV VACCINES (2 - Risk 3-dose series) 04/16/2024 (Originally 11/14/2011)   Hepatitis C Screening  06/07/2024 (Originally 04/24/2012)   HIV Screening  06/07/2024 (Originally 04/24/2009)   INFLUENZA VACCINE  08/19/2024 (Originally 12/21/2023)   Cervical Cancer Screening (Pap smear)  09/02/2029 (Originally 04/25/2015)   DTaP/Tdap/Td (9 - Td or Tdap) 12/31/2031   Hepatitis B Vaccines 19-59 Average Risk  Completed   Pneumococcal Vaccine  Aged Out   Meningococcal B Vaccine  Aged Out    Discussed health benefits of physical activity, and encouraged her to engage in regular exercise appropriate for her age and condition. SABRAZaida was seen today for annual exam.  Diagnoses and all orders for this visit:  Routine physical examination -     CBC with Differential/Platelet -     CMP14+EGFR -     Lipid panel -     TSH -     VITAMIN D 25 Hydroxy (Vit-D Deficiency, Fractures) -     B12 and Folate Panel  Elevated LDL cholesterol level -      Lipid panel -     atorvastatin (LIPITOR) 10 MG tablet; Take 1 tablet (10 mg total) by mouth daily.  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  Medication management -     CBC with Differential/Platelet -     CMP14+EGFR -     Lipid panel -     TSH -     VITAMIN D 25 Hydroxy (Vit-D Deficiency, Fractures) -     B12 and Folate Panel  Primary central diabetes insipidus (HCC)   Fasting labs ordered, pt was agitated and not able to collect. Will bring her back and have labs drawn Refilled lipitor and folic acid No need for pap smear BP a little elevated today but patient was agitated than usual and wound not sit down  Followed by endocrinology and neurology for primary central DM insipidus and Intractable Lennox-Gastaut syndrome   Return in about 1 year (around 01/03/2025).     Steph Cheadle, PA-C

## 2024-01-07 DIAGNOSIS — E232 Diabetes insipidus: Secondary | ICD-10-CM | POA: Insufficient documentation

## 2024-01-22 ENCOUNTER — Encounter: Payer: Self-pay | Admitting: Sports Medicine

## 2024-01-24 ENCOUNTER — Ambulatory Visit: Payer: MEDICAID

## 2024-01-24 VITALS — BP 110/78 | HR 74

## 2024-01-24 DIAGNOSIS — Z3042 Encounter for surveillance of injectable contraceptive: Secondary | ICD-10-CM | POA: Diagnosis not present

## 2024-01-24 MED ORDER — MEDROXYPROGESTERONE ACETATE 150 MG/ML IM SUSP: 150.0000 mg | Freq: Once | INTRAMUSCULAR | Status: DC

## 2024-01-24 NOTE — Progress Notes (Signed)
 Depo-porvera injection nurse visit. Admin 150mg  IM LUOQ. Patient tolerated injection well without complications.

## 2024-01-24 NOTE — Patient Instructions (Addendum)
 Return between Nov 20 - Dec 18 for next Depo-Provera  injection.

## 2024-04-08 ENCOUNTER — Ambulatory Visit: Payer: MEDICAID

## 2024-04-09 NOTE — Progress Notes (Signed)
   Subjective:    Patient ID: Elizabeth [name], female    DOB: May 03, 1996, 28 y.o.   MRN: [id number]  HPI  Patient is here for a Depo-Provera  injection. Denies CP, SOB, headaches, mood changes, or problems with medication.  Review of Systems     Objective:   Physical Exam        Assessment & Plan:   Depo injection given in RUOQ. Pt tolerated well. No redness or swelling noted at the site. Patient advised to schedule next injection between 11-13 wks (around 06/26/24-07/10/24) for next depo injection.

## 2024-04-10 ENCOUNTER — Ambulatory Visit (INDEPENDENT_AMBULATORY_CARE_PROVIDER_SITE_OTHER): Payer: MEDICAID

## 2024-04-10 DIAGNOSIS — Z3042 Encounter for surveillance of injectable contraceptive: Secondary | ICD-10-CM

## 2024-04-28 ENCOUNTER — Telehealth: Payer: Self-pay

## 2024-04-28 MED ORDER — TRAZODONE HCL 50 MG PO TABS: ORAL_TABLET | ORAL | 3 refills | Status: AC

## 2024-04-28 MED ORDER — TOPIRAMATE 100 MG PO TABS: 100.0000 mg | ORAL_TABLET | Freq: Every day | ORAL | 3 refills | Status: AC

## 2024-04-28 NOTE — Telephone Encounter (Signed)
 Refills sent

## 2024-04-28 NOTE — Telephone Encounter (Signed)
 Patients mom is requesting refill on the following medications topiramate  (TOPAMAX )  traZODone  (DESYREL ) Please advise, thanks.

## 2024-06-26 ENCOUNTER — Telehealth: Payer: Self-pay

## 2024-06-26 NOTE — Telephone Encounter (Signed)
 Received a message: patients mom Thyra) would like a call so she can explain that patient needs sodium labs drawn.   Attempted to reach pt's mother left detailed message for return call back to clinic in regards to lab.

## 2024-06-27 ENCOUNTER — Ambulatory Visit (INDEPENDENT_AMBULATORY_CARE_PROVIDER_SITE_OTHER): Payer: MEDICAID

## 2024-06-27 ENCOUNTER — Other Ambulatory Visit: Payer: Self-pay

## 2024-06-27 DIAGNOSIS — E871 Hypo-osmolality and hyponatremia: Secondary | ICD-10-CM

## 2024-06-27 DIAGNOSIS — Z3042 Encounter for surveillance of injectable contraceptive: Secondary | ICD-10-CM

## 2024-06-27 NOTE — Progress Notes (Signed)
" ° °  Subjective:    Patient ID: Elizabeth [name], female    DOB: 1995/09/15, 29 y.o.   MRN: [id number]  HPI   Patient is here for a Depo-Provera  injection. Denies CP, SOB, headaches, mood changes, or problems with medication.   Review of Systems     Objective:   Physical Exam        Assessment & Plan:   Depo injection given in LUOQ. Pt tolerated well. No redness or swelling noted at the site. Patient advised to schedule next injection between 11-13 wks (around 09/12/24-09/26/24) for next depo injection.  "

## 2024-06-27 NOTE — Progress Notes (Unsigned)
bmpbmp
